# Patient Record
Sex: Female | Born: 1970 | Race: Black or African American | Hispanic: No | Marital: Single | Smoking: Never smoker
Health system: Southern US, Community
[De-identification: ages and names within clinical notes are randomized; demographics above are authoritative.]

## PROBLEM LIST (undated history)

## (undated) HISTORY — PX: TUBAL LIGATION: SHX77

---

## 2006-09-07 ENCOUNTER — Emergency Department: Payer: Self-pay | Admitting: Emergency Medicine

## 2010-10-21 ENCOUNTER — Ambulatory Visit: Payer: Self-pay | Admitting: Gastroenterology

## 2010-10-24 LAB — PATHOLOGY REPORT

## 2010-12-14 ENCOUNTER — Emergency Department: Payer: Self-pay | Admitting: Emergency Medicine

## 2012-10-08 ENCOUNTER — Ambulatory Visit: Payer: Self-pay | Admitting: Family Medicine

## 2012-10-17 ENCOUNTER — Ambulatory Visit: Payer: Self-pay | Admitting: Family Medicine

## 2013-01-16 ENCOUNTER — Ambulatory Visit: Payer: Self-pay | Admitting: Family Medicine

## 2014-12-10 LAB — HM PAP SMEAR: HM PAP: NEGATIVE

## 2015-04-07 LAB — HM PAP SMEAR: HM Pap smear: NORMAL

## 2015-06-11 ENCOUNTER — Other Ambulatory Visit: Payer: Self-pay | Admitting: Family Medicine

## 2015-06-11 DIAGNOSIS — Z1231 Encounter for screening mammogram for malignant neoplasm of breast: Secondary | ICD-10-CM

## 2015-06-21 ENCOUNTER — Ambulatory Visit
Admission: RE | Admit: 2015-06-21 | Discharge: 2015-06-21 | Disposition: A | Discharge status: Home / Self Care | Payer: 59 | Source: Ambulatory Visit | Attending: Family Medicine | Admitting: Family Medicine

## 2015-06-21 DIAGNOSIS — Z1231 Encounter for screening mammogram for malignant neoplasm of breast: Secondary | ICD-10-CM | POA: Insufficient documentation

## 2016-06-05 ENCOUNTER — Other Ambulatory Visit: Payer: Self-pay | Admitting: Family Medicine

## 2016-06-05 DIAGNOSIS — Z1231 Encounter for screening mammogram for malignant neoplasm of breast: Secondary | ICD-10-CM

## 2016-06-22 ENCOUNTER — Other Ambulatory Visit: Payer: Self-pay | Admitting: Family Medicine

## 2016-06-22 ENCOUNTER — Ambulatory Visit
Admission: RE | Admit: 2016-06-22 | Discharge: 2016-06-22 | Disposition: A | Discharge status: Home / Self Care | Payer: 59 | Source: Ambulatory Visit | Attending: Family Medicine | Admitting: Family Medicine

## 2016-06-22 DIAGNOSIS — Z1231 Encounter for screening mammogram for malignant neoplasm of breast: Secondary | ICD-10-CM | POA: Diagnosis not present

## 2017-06-06 LAB — LIPID PANEL
CHOLESTEROL: 198 (ref 0–200)
HDL: 66 (ref 35–70)
LDL Cholesterol: 112
Triglycerides: 98 (ref 40–160)

## 2017-06-06 LAB — HEMOGLOBIN A1C: Hemoglobin A1C: 5.3

## 2017-07-12 ENCOUNTER — Other Ambulatory Visit: Payer: Self-pay | Admitting: Family Medicine

## 2017-07-23 ENCOUNTER — Ambulatory Visit (INDEPENDENT_AMBULATORY_CARE_PROVIDER_SITE_OTHER): Payer: 59 | Admitting: Family Medicine

## 2017-07-23 ENCOUNTER — Encounter: Payer: Self-pay | Admitting: Family Medicine

## 2017-07-23 VITALS — BP 106/71 | HR 73 | Temp 98.3°F | Ht 65.1 in | Wt 180.4 lb

## 2017-07-23 DIAGNOSIS — Z1239 Encounter for other screening for malignant neoplasm of breast: Secondary | ICD-10-CM

## 2017-07-23 DIAGNOSIS — Z113 Encounter for screening for infections with a predominantly sexual mode of transmission: Secondary | ICD-10-CM

## 2017-07-23 DIAGNOSIS — Z0000 Encounter for general adult medical examination without abnormal findings: Secondary | ICD-10-CM

## 2017-07-23 DIAGNOSIS — Z23 Encounter for immunization: Secondary | ICD-10-CM | POA: Diagnosis not present

## 2017-07-23 DIAGNOSIS — K219 Gastro-esophageal reflux disease without esophagitis: Secondary | ICD-10-CM | POA: Diagnosis not present

## 2017-07-23 LAB — UA/M W/RFLX CULTURE, ROUTINE
Bilirubin, UA: NEGATIVE
Glucose, UA: NEGATIVE
Ketones, UA: NEGATIVE
Leukocytes, UA: NEGATIVE
Nitrite, UA: NEGATIVE
Protein, UA: NEGATIVE
RBC, UA: NEGATIVE
Specific Gravity, UA: 1.015 (ref 1.005–1.030)
Urobilinogen, Ur: 1 mg/dL (ref 0.2–1.0)
pH, UA: 7 (ref 5.0–7.5)

## 2017-07-23 LAB — MICROSCOPIC EXAMINATION
BACTERIA UA: NONE SEEN
RBC MICROSCOPIC, UA: NONE SEEN /HPF (ref 0–?)
WBC UA: NONE SEEN /HPF (ref 0–?)

## 2017-07-23 MED ORDER — OMEPRAZOLE 20 MG PO CPDR: 20.0000 mg | DELAYED_RELEASE_CAPSULE | Freq: Every day | ORAL | 3 refills | Status: DC

## 2017-07-23 NOTE — Progress Notes (Signed)
BP 106/71 (BP Location: Left Arm, Patient Position: Sitting, Cuff Size: Large)   Pulse 73   Temp 98.3 F (36.8 C)   Ht 5' 5.1" (1.654 m)   Wt 180 lb 7 oz (81.8 kg)   LMP 07/16/2017 (Approximate)   SpO2 99%   BMI 29.93 kg/m    Subjective:    Patient ID: Nancy Phillips, female    DOB: 20-Feb-1971, 46 y.o.   MRN: [id number]  HPI: Nancy Phillips is a 46 y.o. female presenting on 07/23/2017 to establish care and for comprehensive medical examination. She had been following with [facility] until last year. Had physical done in June 2017. Current medical complaints include:   GERD GERD control status: exacerbated  Satisfied with current treatment? no Heartburn frequency: at least 1x a week Medication side effects: Not on anything  Previous GERD medications: omeprazole Antacid use frequency:  Doesn't take Duration: for the past couple of months Nature: burning Location: sternal Heartburn duration: few days  Alleviatiating factors: Drinking a lot of water Aggravating factors: eating certain things Dysphagia: yes Odynophagia:  yes Hematemesis: no Blood in stool: no EGD: no  She currently lives with: husband Menopausal Symptoms: no  Depression Screen done today and results listed below:  Depression screen [facility] 2/9 07/23/2017  Decreased Interest 0  Down, Depressed, Hopeless 0  PHQ - 2 Score 0    Past Medical History:  No past medical history on file.  Surgical History:  Past Surgical History:  Procedure Laterality Date  . TUBAL LIGATION      Medications:  No current outpatient prescriptions on file prior to visit.   No current facility-administered medications on file prior to visit.     Allergies:  No Known Allergies  Social History:  Social History   Social History  . Marital status: Married    Spouse name: N/A  . Number of children: N/A  . Years of education: N/A   Occupational History  . Not on file.   Social History Main Topics  . Smoking status: Never Smoker   . Smokeless tobacco: Never Used  . Alcohol use No  . Drug use: No  . Sexual activity: Yes    Birth control/ protection: None   Other Topics Concern  . Not on file   Social History Narrative  . No narrative on file   History  Smoking Status  . Never Smoker  Smokeless Tobacco  . Never Used   History  Alcohol Use No    Family History:  Family History  Problem Relation Age of Onset  . Rheum arthritis Mother   . Hypertension Mother   . Fibromyalgia Mother   . Stroke Mother   . Cancer Father        Rare stomach cancer  . Cancer Brother        Lung  . Cancer Maternal Grandmother        Cancer  . Stroke Maternal Grandfather   . Birth defects Paternal Grandmother        Rare stomach cancer  . Cancer Sister        Pancreatic  . Cancer Sister        Pancreatic  . Breast cancer Neg Hx     Past medical history, surgical history, medications, allergies, family history and social history reviewed with patient today and changes made to appropriate areas of the chart.   Review of Systems  Constitutional: Positive for diaphoresis. Negative for chills, fever, malaise/fatigue and weight loss.  HENT:  Negative.   Eyes: Negative.   Respiratory: Negative.   Cardiovascular: Negative.   Gastrointestinal: Positive for constipation, diarrhea and heartburn. Negative for abdominal pain, blood in stool, melena, nausea and vomiting.  Genitourinary: Negative.   Musculoskeletal: Negative.   Skin: Negative.   Neurological: Negative.  Negative for weakness.  Endo/Heme/Allergies: Negative.  Negative for environmental allergies and polydipsia. Does not bruise/bleed easily.  Psychiatric/Behavioral: Negative.     All other ROS negative except what is listed above and in the HPI.      Objective:    BP 106/71 (BP Location: Left Arm, Patient Position: Sitting, Cuff Size: Large)   Pulse 73   Temp 98.3 F (36.8 C)   Ht 5' 5.1" (1.654 m)   Wt 180 lb 7 oz (81.8 kg)   LMP 07/16/2017  (Approximate)   SpO2 99%   BMI 29.93 kg/m   Wt Readings from Last 3 Encounters:  07/23/17 180 lb 7 oz (81.8 kg)    Physical Exam  Constitutional: She is oriented to person, place, and time. She appears well-developed and well-nourished. No distress.  HENT:  Head: Normocephalic and atraumatic.  Right Ear: Hearing normal.  Left Ear: Hearing normal.  Nose: Nose normal.  Eyes: Conjunctivae and lids are normal. Right eye exhibits no discharge. Left eye exhibits no discharge. No scleral icterus.  Pulmonary/Chest: Effort normal. No respiratory distress.  Genitourinary:  Genitourinary Comments: Breast and pelvic exams deferred with shared decision making.   Musculoskeletal: Normal range of motion.  Neurological: She is alert and oriented to person, place, and time.  Skin: Skin is intact. No rash noted. She is not diaphoretic.  Psychiatric: She has a normal mood and affect. Her speech is normal and behavior is normal. Judgment and thought content normal. Cognition and memory are normal.    Results for orders placed or performed in visit on 07/23/17  HM PAP SMEAR  Result Value Ref Range   HM Pap smear normal   HM PAP SMEAR  Result Value Ref Range   HM Pap smear Negative, Negative HPV   Lipid panel  Result Value Ref Range   Triglycerides 98 40 - 160   Cholesterol 198 0 - 200   HDL 66 35 - 70   LDL Cholesterol 112   Hemoglobin A1c  Result Value Ref Range   Hemoglobin A1C 5.3       Assessment & Plan:   Problem List Items Addressed This Visit      Digestive   Gastroesophageal reflux disease    Having some dysphagia. Will start omeprazole and recheck in 1 month, if not better, will refer to GI.      Relevant Medications   omeprazole (PRILOSEC) 20 MG capsule   Other Relevant Orders   CBC with Differential/Platelet   Comprehensive metabolic panel   UA/M w/rflx Culture, Routine    Other Visit Diagnoses    Routine general medical examination at a health care facility    -   Primary   Vaccines updated. Screening labs checked today. Pap up to date. Mammogram ordered. Continue diet and exercise. Call with any concerns.    Relevant Orders   CBC with Differential/Platelet   Comprehensive metabolic panel   TSH   UA/M w/rflx Culture, Routine   Screening for STD (sexually transmitted disease)       Labs drawn today. Await results.    Relevant Orders   GC/Chlamydia Probe Amp   Hepatitis C antibody   HSV(herpes simplex vrs) 1+2 ab-IgG  HIV antibody   RPR   Screening for breast cancer       Mammogram ordered today.   Relevant Orders   MM Digital Screening   Immunization due       Tdap given today.   Relevant Orders   Tdap vaccine greater than or equal to 7yo IM (Completed)       Follow up plan: Return in about 4 weeks (around 08/20/2017) for Follow up swallowing and GERD.   LABORATORY TESTING:  - Pap smear: up to date  IMMUNIZATIONS:   - Tdap: Tetanus vaccination status reviewed: Tdap vaccination indicated and given today. - Influenza: Given elsewhere  SCREENING: -Mammogram: Ordered today   PATIENT COUNSELING:   Advised to take 1 mg of folate supplement per day if capable of pregnancy.   Sexuality: Discussed sexually transmitted diseases, partner selection, use of condoms, avoidance of unintended pregnancy  and contraceptive alternatives.   Advised to avoid cigarette smoking.  I discussed with the patient that most people either abstain from alcohol or drink within safe limits (<=14/week and <=4 drinks/occasion for males, <=7/weeks and <= 3 drinks/occasion for females) and that the risk for alcohol disorders and other health effects rises proportionally with the number of drinks per week and how often a drinker exceeds daily limits.  Discussed cessation/primary prevention of drug use and availability of treatment for abuse.   Diet: Encouraged to adjust caloric intake to maintain  or achieve ideal body weight, to reduce intake of dietary  saturated fat and total fat, to limit sodium intake by avoiding high sodium foods and not adding table salt, and to maintain adequate dietary potassium and calcium preferably from fresh fruits, vegetables, and low-fat dairy products.    stressed the importance of regular exercise  Injury prevention: Discussed safety belts, safety helmets, smoke detector, smoking near bedding or upholstery.   Dental health: Discussed importance of regular tooth brushing, flossing, and dental visits.    NEXT PREVENTATIVE PHYSICAL DUE IN 1 YEAR. Return in about 4 weeks (around 08/20/2017) for Follow up swallowing and GERD.

## 2017-07-23 NOTE — Progress Notes (Deleted)
Ht 5' 5.1" (1.654 m)   Wt 180 lb 7 oz (81.8 kg)   LMP 07/16/2017 (Approximate)   BMI 29.93 kg/m    Subjective:    Patient ID: Nancy Phillips, female    DOB: 1971/01/12, 46 y.o.   MRN: [id number]  HPI: Nancy Phillips is a 46 y.o. female  Chief Complaint  Patient presents with  . Establish Care  . Gastroesophageal Reflux    Active Ambulatory Problems    Diagnosis Date Noted  . No Active Ambulatory Problems   Resolved Ambulatory Problems    Diagnosis Date Noted  . No Resolved Ambulatory Problems   No Additional Past Medical History   No past surgical history on file. No outpatient encounter prescriptions on file as of 07/23/2017.   No facility-administered encounter medications on file as of 07/23/2017.    No Known Allergies Family History  Problem Relation Age of Onset  . Breast cancer Neg Hx    Social History   Social History  . Marital status: Married    Spouse name: N/A  . Number of children: N/A  . Years of education: N/A   Occupational History  . Not on file.   Social History Main Topics  . Smoking status: Not on file  . Smokeless tobacco: Not on file  . Alcohol use Not on file  . Drug use: Unknown  . Sexual activity: Not on file   Other Topics Concern  . Not on file   Social History Narrative  . No narrative on file     Review of Systems  Per HPI unless specifically indicated above     Objective:    Ht 5' 5.1" (1.654 m)   Wt 180 lb 7 oz (81.8 kg)   LMP 07/16/2017 (Approximate)   BMI 29.93 kg/m   Wt Readings from Last 3 Encounters:  07/23/17 180 lb 7 oz (81.8 kg)    Physical Exam  Results for orders placed or performed in visit on 10/21/10  Pathology Report  Result Value Ref Range   Patholgy report      ========== TEST NAME ==========  ========= RESULTS =========  = REFERENCE RANGE =  PATHOLOGY REPORT Pathology Report .                               [   Final Report         ]                   Material submitted:                                              Marland Kitchen PART A: ANTRUM AND BODY OF STOMACH COLD BIOPSIES PART B: GE JUNCTION COLD BIOPSIES .                               [   Final Report         ]                   Clinician provided ICD-9: 787.02 ; Nausea alone 530.81 ; Esophageal reflux .                               [  Final Report         ]                   Pre-operative diagnosis:                                        . NAUSEA, GERD .                               [   Final Report         ]                   ********************************************************************** Diagnosis: Part A: ANTRUM AND BODY OF STOMACH COLD BIOPSIES: - ANTRAL AND OXYNTIC MUCOSA WITH MILD CHRONIC GASTRITIS. - NEGATIVE FOR H.PYLORI, DYSPLASIA AND MALIGNANCY. . Part B:  GE JUNCTION COLD BIOPSIES: - SQUAMOCOLUMNAR MUCOSA WITH REFLUX GASTROESOPHAGITIS. - NEGATIVE FOR DYSPLASIA AND MALIGNANCY. . . Comment A Diff Quick stain was examined and is negative for H.pylori. Controls worked appropriately. RUB/10/24/2010 ********************************************************************** .                               [   Final Report         ]                   Electronically signed:                                          Marland Kitchen Hulda Humphrey, MD, Pathologist .                               [   Final Report         ]                   Gross description:                                              . A.  Antrum and Body of Stomach: Received in a formalin filled container labeled Nancy Phillips are two tan irregular soft tissue fragments ranging from 0.2 to 0.3 cm in greatest dimensions. Biopsies are submitted as received in one cassette. . B.  GE Junction: Received in a formalin filled container labeled Nancy Phillips is a tan irregula r soft tissue fragment 0.2 cm in greatest dimensions.  Biopsies submitted as received in one cassette. DMG/ASB .                               [   Final Report         ]                    Pathologist provided ICD-9: 535.50, 530.11 .                               [  Final Report         ]                   CPT                                                                  . [id number], [id number], [id number], [phone]   Additional Info: [id number]           [facility]            No: [id number]           [address], [place], [place] [address]           Lindon Romp, MD         [phone]                                         Co: [phone]         Assessment & Plan:   Problem List Items Addressed This Visit    None       Follow up plan: No Follow-up on file.

## 2017-07-23 NOTE — Patient Instructions (Addendum)
Health Maintenance, Female Adopting a healthy lifestyle and getting preventive care can go a long way to promote health and wellness. Talk with your health care provider about what schedule of regular examinations is right for you. This is a good chance for you to check in with your provider about disease prevention and staying healthy. In between checkups, there are plenty of things you can do on your own. Experts have done a lot of research about which lifestyle changes and preventive measures are most likely to keep you healthy. Ask your health care provider for more information. Weight and diet Eat a healthy diet  Be sure to include plenty of vegetables, fruits, low-fat dairy products, and lean protein.  Do not eat a lot of foods high in solid fats, added sugars, or salt.  Get regular exercise. This is one of the most important things you can do for your health. ? Most adults should exercise for at least 150 minutes each week. The exercise should increase your heart rate and make you sweat (moderate-intensity exercise). ? Most adults should also do strengthening exercises at least twice a week. This is in addition to the moderate-intensity exercise.  Maintain a healthy weight  Body mass index (BMI) is a measurement that can be used to identify possible weight problems. It estimates body fat based on height and weight. Your health care provider can help determine your BMI and help you achieve or maintain a healthy weight.  For females 20 years of age and older: ? A BMI below 18.5 is considered underweight. ? A BMI of 18.5 to 24.9 is normal. ? A BMI of 25 to 29.9 is considered overweight. ? A BMI of 30 and above is considered obese.  Watch levels of cholesterol and blood lipids  You should start having your blood tested for lipids and cholesterol at 45 years of age, then have this test every 5 years.  You may need to have your cholesterol levels checked more often if: ? Your lipid or  cholesterol levels are high. ? You are older than 45 years of age. ? You are at high risk for heart disease.  Cancer screening Lung Cancer  Lung cancer screening is recommended for adults 55-80 years old who are at high risk for lung cancer because of a history of smoking.  A yearly low-dose CT scan of the lungs is recommended for people who: ? Currently smoke. ? Have quit within the past 15 years. ? Have at least a 30-pack-year history of smoking. A pack year is smoking an average of one pack of cigarettes a day for 1 year.  Yearly screening should continue until it has been 15 years since you quit.  Yearly screening should stop if you develop a health problem that would prevent you from having lung cancer treatment.  Breast Cancer  Practice breast self-awareness. This means understanding how your breasts normally appear and feel.  It also means doing regular breast self-exams. Let your health care provider know about any changes, no matter how small.  If you are in your 20s or 30s, you should have a clinical breast exam (CBE) by a health care provider every 1-3 years as part of a regular health exam.  If you are 40 or older, have a CBE every year. Also consider having a breast X-ray (mammogram) every year.  If you have a family history of breast cancer, talk to your health care provider about genetic screening.  If you are at high risk   for breast cancer, talk to your health care provider about having an MRI and a mammogram every year.  Breast cancer gene (BRCA) assessment is recommended for women who have family members with BRCA-related cancers. BRCA-related cancers include: ? Breast. ? Ovarian. ? Tubal. ? Peritoneal cancers.  Results of the assessment will determine the need for genetic counseling and BRCA1 and BRCA2 testing.  Cervical Cancer Your health care provider may recommend that you be screened regularly for cancer of the pelvic organs (ovaries, uterus, and  vagina). This screening involves a pelvic examination, including checking for microscopic changes to the surface of your cervix (Pap test). You may be encouraged to have this screening done every 3 years, beginning at age 22.  For women ages 56-65, health care providers may recommend pelvic exams and Pap testing every 3 years, or they may recommend the Pap and pelvic exam, combined with testing for human papilloma virus (HPV), every 5 years. Some types of HPV increase your risk of cervical cancer. Testing for HPV may also be done on women of any age with unclear Pap test results.  Other health care providers may not recommend any screening for nonpregnant women who are considered low risk for pelvic cancer and who do not have symptoms. Ask your health care provider if a screening pelvic exam is right for you.  If you have had past treatment for cervical cancer or a condition that could lead to cancer, you need Pap tests and screening for cancer for at least 20 years after your treatment. If Pap tests have been discontinued, your risk factors (such as having a new sexual partner) need to be reassessed to determine if screening should resume. Some women have medical problems that increase the chance of getting cervical cancer. In these cases, your health care provider may recommend more frequent screening and Pap tests.  Colorectal Cancer  This type of cancer can be detected and often prevented.  Routine colorectal cancer screening usually begins at 45 years of age and continues through 45 years of age.  Your health care provider may recommend screening at an earlier age if you have risk factors for colon cancer.  Your health care provider may also recommend using home test kits to check for hidden blood in the stool.  A small camera at the end of a tube can be used to examine your colon directly (sigmoidoscopy or colonoscopy). This is done to check for the earliest forms of colorectal  cancer.  Routine screening usually begins at age 33.  Direct examination of the colon should be repeated every 5-10 years through 45 years of age. However, you may need to be screened more often if early forms of precancerous polyps or small growths are found.  Skin Cancer  Check your skin from head to toe regularly.  Tell your health care provider about any new moles or changes in moles, especially if there is a change in a mole's shape or color.  Also tell your health care provider if you have a mole that is larger than the size of a pencil eraser.  Always use sunscreen. Apply sunscreen liberally and repeatedly throughout the day.  Protect yourself by wearing long sleeves, pants, a wide-brimmed hat, and sunglasses whenever you are outside.  Heart disease, diabetes, and high blood pressure  High blood pressure causes heart disease and increases the risk of stroke. High blood pressure is more likely to develop in: ? People who have blood pressure in the high end of  the normal range (130-139/85-89 mm Hg). ? People who are overweight or obese. ? People who are African American.  If you are 21-29 years of age, have your blood pressure checked every 3-5 years. If you are 3 years of age or older, have your blood pressure checked every year. You should have your blood pressure measured twice-once when you are at a hospital or clinic, and once when you are not at a hospital or clinic. Record the average of the two measurements. To check your blood pressure when you are not at a hospital or clinic, you can use: ? An automated blood pressure machine at a pharmacy. ? A home blood pressure monitor.  If you are between 17 years and 37 years old, ask your health care provider if you should take aspirin to prevent strokes.  Have regular diabetes screenings. This involves taking a blood sample to check your fasting blood sugar level. ? If you are at a normal weight and have a low risk for diabetes,  have this test once every three years after 45 years of age. ? If you are overweight and have a high risk for diabetes, consider being tested at a younger age or more often. Preventing infection Hepatitis B  If you have a higher risk for hepatitis B, you should be screened for this virus. You are considered at high risk for hepatitis B if: ? You were born in a country where hepatitis B is common. Ask your health care provider which countries are considered high risk. ? Your parents were born in a high-risk country, and you have not been immunized against hepatitis B (hepatitis B vaccine). ? You have HIV or AIDS. ? You use needles to inject street drugs. ? You live with someone who has hepatitis B. ? You have had sex with someone who has hepatitis B. ? You get hemodialysis treatment. ? You take certain medicines for conditions, including cancer, organ transplantation, and autoimmune conditions.  Hepatitis C  Blood testing is recommended for: ? Everyone born from [birth year] through 1964. ? Anyone with known risk factors for hepatitis C.  Sexually transmitted infections (STIs)  You should be screened for sexually transmitted infections (STIs) including gonorrhea and chlamydia if: ? You are sexually active and are younger than 45 years of age. ? You are older than 45 years of age and your health care provider tells you that you are at risk for this type of infection. ? Your sexual activity has changed since you were last screened and you are at an increased risk for chlamydia or gonorrhea. Ask your health care provider if you are at risk.  If you do not have HIV, but are at risk, it may be recommended that you take a prescription medicine daily to prevent HIV infection. This is called pre-exposure prophylaxis (PrEP). You are considered at risk if: ? You are sexually active and do not regularly use condoms or know the HIV status of your partner(s). ? You take drugs by injection. ? You are  sexually active with a partner who has HIV.  Talk with your health care provider about whether you are at high risk of being infected with HIV. If you choose to begin PrEP, you should first be tested for HIV. You should then be tested every 3 months for as long as you are taking PrEP. Pregnancy  If you are premenopausal and you may become pregnant, ask your health care provider about preconception counseling.  If you may become  pregnant, take 400 to 800 micrograms (mcg) of folic acid every day.  If you want to prevent pregnancy, talk to your health care provider about birth control (contraception). Osteoporosis and menopause  Osteoporosis is a disease in which the bones lose minerals and strength with aging. This can result in serious bone fractures. Your risk for osteoporosis can be identified using a bone density scan.  If you are 63 years of age or older, or if you are at risk for osteoporosis and fractures, ask your health care provider if you should be screened.  Ask your health care provider whether you should take a calcium or vitamin D supplement to lower your risk for osteoporosis.  Menopause may have certain physical symptoms and risks.  Hormone replacement therapy may reduce some of these symptoms and risks. Talk to your health care provider about whether hormone replacement therapy is right for you. Follow these instructions at home:  Schedule regular health, dental, and eye exams.  Stay current with your immunizations.  Do not use any tobacco products including cigarettes, chewing tobacco, or electronic cigarettes.  If you are pregnant, do not drink alcohol.  If you are breastfeeding, limit how much and how often you drink alcohol.  Limit alcohol intake to no more than 1 drink per day for nonpregnant women. One drink equals 12 ounces of beer, 5 ounces of wine, or 1 ounces of hard liquor.  Do not use street drugs.  Do not share needles.  Ask your health care  provider for help if you need support or information about quitting drugs.  Tell your health care provider if you often feel depressed.  Tell your health care provider if you have ever been abused or do not feel safe at home. This information is not intended to replace advice given to you by your health care provider. Make sure you discuss any questions you have with your health care provider. Document Released: 05/08/2011 Document Revised: 03/30/2016 Document Reviewed: 07/27/2015 Elsevier Interactive Patient Education  2018 [employer] for Gastroesophageal Reflux Disease, Adult When you have gastroesophageal reflux disease (GERD), the foods you eat and your eating habits are very important. Choosing the right foods can help ease the discomfort of GERD. Consider working with a diet and nutrition specialist (dietitian) to help you make healthy food choices. What general guidelines should I follow? Eating plan  Choose healthy foods low in fat, such as fruits, vegetables, whole grains, low-fat dairy products, and lean meat, fish, and poultry.  Eat frequent, small meals instead of three large meals each day. Eat your meals slowly, in a relaxed setting. Avoid bending over or lying down until 2-3 hours after eating.  Limit high-fat foods such as fatty meats or fried foods.  Limit your intake of oils, butter, and shortening to less than 8 teaspoons each day.  Avoid the following: ? Foods that cause symptoms. These may be different for different people. Keep a food diary to keep track of foods that cause symptoms. ? Alcohol. ? Drinking large amounts of liquid with meals. ? Eating meals during the 2-3 hours before bed.  Cook foods using methods other than frying. This may include baking, grilling, or broiling. Lifestyle   Maintain a healthy weight. Ask your health care provider what weight is healthy for you. If you need to lose weight, work with your health care provider to  do so safely.  Exercise for at least 30 minutes on 5 or more days each week, or  as told by your health care provider.  Avoid wearing clothes that fit tightly around your waist and chest.  Do not use any products that contain nicotine or tobacco, such as cigarettes and e-cigarettes. If you need help quitting, ask your health care provider.  Sleep with the head of your bed raised. Use a wedge under the mattress or blocks under the bed frame to raise the head of the bed. What foods are not recommended? The items listed may not be a complete list. Talk with your dietitian about what dietary choices are best for you. Grains Pastries or quick breads with added fat. [place] toast. Vegetables Deep fried vegetables. [place] fries. Any vegetables prepared with added fat. Any vegetables that cause symptoms. For some people this may include tomatoes and tomato products, chili peppers, onions and garlic, and horseradish. Fruits Any fruits prepared with added fat. Any fruits that cause symptoms. For some people this may include citrus fruits, such as oranges, grapefruit, pineapple, and lemons. Meats and other protein foods High-fat meats, such as fatty beef or pork, hot dogs, ribs, ham, sausage, salami and bacon. Fried meat or protein, including fried fish and fried chicken. Nuts and nut butters. Dairy Whole milk and chocolate milk. Sour cream. Cream. Ice cream. Cream cheese. Milk shakes. Beverages Coffee and tea, with or without caffeine. Carbonated beverages. Sodas. Energy drinks. Fruit juice made with acidic fruits (such as orange or grapefruit). Tomato juice. Alcoholic drinks. Fats and oils Butter. Margarine. Shortening. Ghee. Sweets and desserts Chocolate and cocoa. Donuts. Seasoning and other foods Pepper. Peppermint and spearmint. Any condiments, herbs, or seasonings that cause symptoms. For some people, this may include curry, hot sauce, or vinegar-based salad dressings. Summary  When you have  gastroesophageal reflux disease (GERD), food and lifestyle choices are very important to help ease the discomfort of GERD.  Eat frequent, small meals instead of three large meals each day. Eat your meals slowly, in a relaxed setting. Avoid bending over or lying down until 2-3 hours after eating.  Limit high-fat foods such as fatty meat or fried foods. This information is not intended to replace advice given to you by your health care provider. Make sure you discuss any questions you have with your health care provider. Document Released: 10/23/2005 Document Revised: 10/24/2016 Document Reviewed: 10/24/2016 Elsevier Interactive Patient Education  2017 [employer]. Tdap Vaccine (Tetanus, Diphtheria and Pertussis): What You Need to Know 1. Why get vaccinated? Tetanus, diphtheria and pertussis are very serious diseases. Tdap vaccine can protect [place] from these diseases. And, Tdap vaccine given to pregnant women can protect newborn babies against pertussis. TETANUS (Lockjaw) is rare in the [place] today. It causes painful muscle tightening and stiffness, usually all over the body.  It can lead to tightening of muscles in the head and neck so you can't open your mouth, swallow, or sometimes even breathe. Tetanus kills about 1 out of 10 people who are infected even after receiving the best medical care.  DIPHTHERIA is also rare in the [place] today. It can cause a thick coating to form in the back of the throat.  It can lead to breathing problems, heart failure, paralysis, and death.  PERTUSSIS (Whooping Cough) causes severe coughing spells, which can cause difficulty breathing, vomiting and disturbed sleep.  It can also lead to weight loss, incontinence, and rib fractures. Up to 2 in 100 adolescents and 5 in 100 adults with pertussis are hospitalized or have complications, which could include pneumonia or death.  These diseases are caused by bacteria. Diphtheria and pertussis are spread  from person to person through secretions from coughing or sneezing. Tetanus enters the body through cuts, scratches, or wounds. Before vaccines, as many as 200,000 cases of diphtheria, 200,000 cases of pertussis, and hundreds of cases of tetanus, were reported in the [place] each year. Since vaccination began, reports of cases for tetanus and diphtheria have dropped by about 99% and for pertussis by about 80%. 2. Tdap vaccine Tdap vaccine can protect adolescents and adults from tetanus, diphtheria, and pertussis. One dose of Tdap is routinely given at age 56 or 15. People who did not get Tdap at that age should get it as soon as possible. Tdap is especially important for healthcare professionals and anyone having close contact with a baby younger than 12 months. Pregnant women should get a dose of Tdap during every pregnancy, to protect the newborn from pertussis. Infants are most at risk for severe, life-threatening complications from pertussis. Another vaccine, called Td, protects against tetanus and diphtheria, but not pertussis. A Td booster should be given every 10 years. Tdap may be given as one of these boosters if you have never gotten Tdap before. Tdap may also be given after a severe cut or burn to prevent tetanus infection. Your doctor or the person giving you the vaccine can give you more information. Tdap may safely be given at the same time as other vaccines. 3. Some people should not get this vaccine  A person who has ever had a life-threatening allergic reaction after a previous dose of any diphtheria, tetanus or pertussis containing vaccine, OR has a severe allergy to any part of this vaccine, should not get Tdap vaccine. Tell the person giving the vaccine about any severe allergies.  Anyone who had coma or long repeated seizures within 7 days after a childhood dose of DTP or DTaP, or a previous dose of Tdap, should not get Tdap, unless a cause other than the vaccine was found.  They can still get Td.  Talk to your doctor if you: ? have seizures or another nervous system problem, ? had severe pain or swelling after any vaccine containing diphtheria, tetanus or pertussis, ? ever had a condition called Guillain-Barr Syndrome (GBS), ? aren't feeling well on the day the shot is scheduled. 4. Risks With any medicine, including vaccines, there is a chance of side effects. These are usually mild and go away on their own. Serious reactions are also possible but are rare. Most people who get Tdap vaccine do not have any problems with it. Mild problems following Tdap: (Did not interfere with activities)  Pain where the shot was given (about 3 in 4 adolescents or 2 in 3 adults)  Redness or swelling where the shot was given (about 1 person in 5)  Mild fever of at least 100.59F (up to about 1 in 25 adolescents or 1 in 100 adults)  Headache (about 3 or 4 people in 10)  Tiredness (about 1 person in 3 or 4)  Nausea, vomiting, diarrhea, stomach ache (up to 1 in 4 adolescents or 1 in 10 adults)  Chills, sore joints (about 1 person in 10)  Body aches (about 1 person in 3 or 4)  Rash, swollen glands (uncommon)  Moderate problems following Tdap: (Interfered with activities, but did not require medical attention)  Pain where the shot was given (up to 1 in 5 or 6)  Redness or swelling where the shot was given (up to  about 1 in 16 adolescents or 1 in 12 adults)  Fever over 102F (about 1 in 100 adolescents or 1 in 250 adults)  Headache (about 1 in 7 adolescents or 1 in 10 adults)  Nausea, vomiting, diarrhea, stomach ache (up to 1 or 3 people in 100)  Swelling of the entire arm where the shot was given (up to about 1 in 500).  Severe problems following Tdap: (Unable to perform usual activities; required medical attention)  Swelling, severe pain, bleeding and redness in the arm where the shot was given (rare).  Problems that could happen after any  vaccine:  People sometimes faint after a medical procedure, including vaccination. Sitting or lying down for about 15 minutes can help prevent fainting, and injuries caused by a fall. Tell your doctor if you feel dizzy, or have vision changes or ringing in the ears.  Some people get severe pain in the shoulder and have difficulty moving the arm where a shot was given. This happens very rarely.  Any medication can cause a severe allergic reaction. Such reactions from a vaccine are very rare, estimated at fewer than 1 in a million doses, and would happen within a few minutes to a few hours after the vaccination. As with any medicine, there is a very remote chance of a vaccine causing a serious injury or death. The safety of vaccines is always being monitored. For more information, visit: http://www.aguilar.org/ 5. What if there is a serious problem? What should I look for? Look for anything that concerns you, such as signs of a severe allergic reaction, very high fever, or unusual behavior. Signs of a severe allergic reaction can include hives, swelling of the face and throat, difficulty breathing, a fast heartbeat, dizziness, and weakness. These would usually start a few minutes to a few hours after the vaccination. What should I do?  If you think it is a severe allergic reaction or other emergency that can't wait, call 9-1-1 or get the person to the nearest hospital. Otherwise, call your doctor.  Afterward, the reaction should be reported to the Vaccine Adverse Event Reporting System (VAERS). Your doctor might file this report, or you can do it yourself through the VAERS web site at www.vaers.SamedayNews.es, or by calling (405)698-5254. ? VAERS does not give medical advice. 6. The National Vaccine Injury Compensation Program The [employer] Vaccine Injury Compensation Program (VICP) is a federal program that was created to compensate people who may have been injured by certain vaccines. Persons who  believe they may have been injured by a vaccine can learn about the program and about filing a claim by calling 3160669920 or visiting the [place] website at GoldCloset.com.ee. There is a time limit to file a claim for compensation. 7. How can I learn more?  Ask your doctor. He or she can give you the vaccine package insert or suggest other sources of information.  Call your local or state health department.  Contact the Centers for Disease Control and Prevention (CDC): ? Call 629-330-5001 (1-800-CDC-INFO) or ? Visit CDC's website at http://hunter.com/ CDC Tdap Vaccine VIS (12/30/13) This information is not intended to replace advice given to you by your health care provider. Make sure you discuss any questions you have with your health care provider. Document Released: 04/23/2012 Document Revised: 07/13/2016 Document Reviewed: 07/13/2016 Elsevier Interactive Patient Education  2017 [employer].

## 2017-07-23 NOTE — Assessment & Plan Note (Signed)
Having some dysphagia. Will start omeprazole and recheck in 1 month, if not better, will refer to GI.

## 2017-07-24 LAB — COMPREHENSIVE METABOLIC PANEL
ALBUMIN: 4.1 g/dL (ref 3.5–5.5)
ALT: 10 IU/L (ref 0–32)
AST: 14 IU/L (ref 0–40)
Albumin/Globulin Ratio: 1.3 (ref 1.2–2.2)
Alkaline Phosphatase: 62 IU/L (ref 39–117)
BUN / CREAT RATIO: 12 (ref 9–23)
BUN: 8 mg/dL (ref 6–24)
CALCIUM: 9.3 mg/dL (ref 8.7–10.2)
CHLORIDE: 102 mmol/L (ref 96–106)
CO2: 24 mmol/L (ref 20–29)
Creatinine, Ser: 0.67 mg/dL (ref 0.57–1.00)
GFR, EST AFRICAN AMERICAN: 123 mL/min/{1.73_m2} (ref >59)
GFR, EST NON AFRICAN AMERICAN: 106 mL/min/{1.73_m2} (ref >59)
GLUCOSE: 91 mg/dL (ref 65–99)
Globulin, Total: 3.2 g/dL (ref 1.5–4.5)
Potassium: 3.8 mmol/L (ref 3.5–5.2)
Sodium: 140 mmol/L (ref 134–144)
TOTAL PROTEIN: 7.3 g/dL (ref 6.0–8.5)

## 2017-07-24 LAB — RPR: RPR: NONREACTIVE

## 2017-07-24 LAB — CBC WITH DIFFERENTIAL/PLATELET
BASOS ABS: 0 10*3/uL (ref 0.0–0.2)
Basos: 0 %
EOS (ABSOLUTE): 0.1 10*3/uL (ref 0.0–0.4)
Eos: 1 %
HEMOGLOBIN: 12.2 g/dL (ref 11.1–15.9)
Hematocrit: 37.3 % (ref 34.0–46.6)
Immature Grans (Abs): 0 10*3/uL (ref 0.0–0.1)
Immature Granulocytes: 0 %
LYMPHS ABS: 2.4 10*3/uL (ref 0.7–3.1)
Lymphs: 39 %
MCH: 27.6 pg (ref 26.6–33.0)
MCHC: 32.7 g/dL (ref 31.5–35.7)
MCV: 84 fL (ref 79–97)
MONOS ABS: 0.4 10*3/uL (ref 0.1–0.9)
Monocytes: 7 %
NEUTROS ABS: 3.2 10*3/uL (ref 1.4–7.0)
Neutrophils: 53 %
Platelets: 362 10*3/uL (ref 150–379)
RBC: 4.42 x10E6/uL (ref 3.77–5.28)
RDW: 14.3 % (ref 12.3–15.4)
WBC: 6.1 10*3/uL (ref 3.4–10.8)

## 2017-07-24 LAB — HIV ANTIBODY (ROUTINE TESTING W REFLEX): HIV SCREEN 4TH GENERATION: NONREACTIVE

## 2017-07-24 LAB — HEPATITIS C ANTIBODY: Hep C Virus Ab: <0.1 s/co ratio (ref 0.0–0.9)

## 2017-07-24 LAB — TSH: TSH: 2.52 u[IU]/mL (ref 0.450–4.500)

## 2017-07-24 LAB — HSV(HERPES SIMPLEX VRS) I + II AB-IGG
HSV 1 Glycoprotein G Ab, IgG: 1.7 index — ABNORMAL HIGH (ref 0.00–0.90)
HSV 2 IgG, Type Spec: >23.6 index — ABNORMAL HIGH (ref 0.00–0.90)

## 2017-07-25 LAB — GC/CHLAMYDIA PROBE AMP

## 2017-07-26 ENCOUNTER — Telehealth: Payer: Self-pay | Admitting: Family Medicine

## 2017-07-26 LAB — GC/CHLAMYDIA PROBE AMP
CHLAMYDIA, DNA PROBE: NEGATIVE
NEISSERIA GONORRHOEAE BY PCR: NEGATIVE

## 2017-07-26 NOTE — Telephone Encounter (Signed)
Patient notified

## 2017-07-26 NOTE — Telephone Encounter (Signed)
Amy, would you please let her know that her labs just came back and everything looks good. 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 Everything else was negative and her regular labs looked great! Thanks!

## 2017-10-10 ENCOUNTER — Ambulatory Visit
Admission: RE | Admit: 2017-10-10 | Discharge: 2017-10-10 | Disposition: A | Discharge status: Home / Self Care | Payer: 59 | Source: Ambulatory Visit | Attending: Family Medicine | Admitting: Family Medicine

## 2017-10-10 DIAGNOSIS — Z1231 Encounter for screening mammogram for malignant neoplasm of breast: Secondary | ICD-10-CM | POA: Insufficient documentation

## 2017-10-10 DIAGNOSIS — Z1239 Encounter for other screening for malignant neoplasm of breast: Secondary | ICD-10-CM

## 2017-10-11 ENCOUNTER — Encounter: Payer: Self-pay | Admitting: Family Medicine

## 2017-12-10 ENCOUNTER — Ambulatory Visit (INDEPENDENT_AMBULATORY_CARE_PROVIDER_SITE_OTHER): Payer: Managed Care, Other (non HMO) | Admitting: Family Medicine

## 2017-12-10 ENCOUNTER — Encounter: Payer: Self-pay | Admitting: Family Medicine

## 2017-12-10 VITALS — BP 143/89 | HR 92 | Temp 97.9°F | Wt 183.0 lb

## 2017-12-10 DIAGNOSIS — R0989 Other specified symptoms and signs involving the circulatory and respiratory systems: Secondary | ICD-10-CM

## 2017-12-10 DIAGNOSIS — N926 Irregular menstruation, unspecified: Secondary | ICD-10-CM | POA: Diagnosis not present

## 2017-12-10 MED ORDER — FLUTICASONE PROPIONATE 50 MCG/ACT NA SUSP: 2.0000 | Freq: Every day | NASAL | 6 refills | Status: DC

## 2017-12-10 MED ORDER — PREDNISONE 50 MG PO TABS: 50.0000 mg | ORAL_TABLET | Freq: Every day | ORAL | 0 refills | Status: DC

## 2017-12-10 NOTE — Progress Notes (Signed)
BP (!) 143/89 (BP Location: Left Arm, Patient Position: Sitting, Cuff Size: Normal)   Pulse 92   Temp 97.9 F (36.6 C)   Wt 183 lb (83 kg)   SpO2 98%   BMI 30.36 kg/m    Subjective:    Patient ID: Nancy Phillips, female    DOB: 1971-04-07, 47 y.o.   MRN: [id number]  HPI: Nancy Phillips is a 47 y.o. female  Chief Complaint  Patient presents with  . Menstrual Problem    Patient states that she was having all of the symptoms of her period, but only spotted, then about two weeks later it started again but she started bleeding heavy for 2.5 days  . lump in throat    Patient states that it feels like she has a lump in her throat.    ABNORMAL MENSTRUAL PERIODS Duration: About 2 months, has symptoms for about 2 weeks out, then will spot and then will have nothing and then bleep a couple of weeks later, then was coming heavily for like 2.5 days. Has not been under extra stress, feeling OK Average interval between menses: Having trouble telling how far between her periods are Length of menses: off and on for several days, then heavy for a couple of days Flow: spotting to heavy Dysmenorrhea: yes Intermenstrual bleeding:yes Postcoital bleeding: no Contraception: tubal ligation Sexual activity: In a Monogamous Relationship History of sexually transmitted diseases: no History GYN procedures: yes Abnormal pap smears: no   Dyspareunia: no Vaginal discharge:no Abdominal pain: no Galactorrhea: no Hirsuitism: no Frequent bruising/mucosal bleeding: no Double vision:no Hot flashes: yes  Has been having some drainage, has been having a lump in her throat and can't get phlegm up, no trouble swallowing. Has been feeling well otherwise  Relevant past medical, surgical, family and social history reviewed and updated as indicated. Interim medical history since our last visit reviewed. Allergies and medications reviewed and updated.  Review of Systems  Constitutional: Negative.   HENT:  Positive for congestion and postnasal drip. Negative for dental problem, drooling, ear discharge, ear pain, facial swelling, hearing loss, mouth sores, nosebleeds, rhinorrhea, sinus pressure, sinus pain, sneezing, sore throat, tinnitus, trouble swallowing and voice change.   Respiratory: Negative.   Cardiovascular: Negative.   Genitourinary: Negative for decreased urine volume, difficulty urinating, dyspareunia, dysuria, enuresis, flank pain, frequency, genital sores, hematuria, menstrual problem, pelvic pain, urgency, vaginal bleeding, vaginal discharge and vaginal pain.  Psychiatric/Behavioral: Negative.     Per HPI unless specifically indicated above     Objective:    BP (!) 143/89 (BP Location: Left Arm, Patient Position: Sitting, Cuff Size: Normal)   Pulse 92   Temp 97.9 F (36.6 C)   Wt 183 lb (83 kg)   SpO2 98%   BMI 30.36 kg/m   Wt Readings from Last 3 Encounters:  12/10/17 183 lb (83 kg)  07/23/17 180 lb 7 oz (81.8 kg)    Physical Exam  Constitutional: She is oriented to person, place, and time. She appears well-developed and well-nourished. No distress.  HENT:  Head: Normocephalic and atraumatic.  Right Ear: Hearing and external ear normal.  Left Ear: Hearing and external ear normal.  Nose: Nose normal.  Mouth/Throat: Oropharynx is clear and moist. No oropharyngeal exudate.  Eyes: Conjunctivae, EOM and lids are normal. Pupils are equal, round, and reactive to light. Right eye exhibits no discharge. Left eye exhibits no discharge. No scleral icterus.  Neck: Normal range of motion. Neck supple. No JVD present. No tracheal deviation  present. No thyromegaly present.  Cardiovascular: Normal rate, regular rhythm, normal heart sounds and intact distal pulses. Exam reveals no gallop and no friction rub.  No murmur heard. Pulmonary/Chest: Effort normal and breath sounds normal. No stridor. No respiratory distress. She has no wheezes. She has no rales. She exhibits no tenderness.    Musculoskeletal: Normal range of motion.  Lymphadenopathy:    She has no cervical adenopathy.  Neurological: She is alert and oriented to person, place, and time.  Skin: Skin is warm, dry and intact. No rash noted. She is not diaphoretic. No erythema. No pallor.  Psychiatric: She has a normal mood and affect. Her speech is normal and behavior is normal. Judgment and thought content normal. Cognition and memory are normal.  Nursing note and vitals reviewed.   Results for orders placed or performed in visit on 07/23/17  GC/Chlamydia Probe Amp  Result Value Ref Range   Chlamydia trachomatis, NAA CANCELED    Neisseria gonorrhoeae by PCR CANCELED   Microscopic Examination  Result Value Ref Range   WBC, UA None seen 0 - 5 /hpf   RBC, UA None seen 0 - 2 /hpf   Epithelial Cells (non renal) 0-10 0 - 10 /hpf   Bacteria, UA None seen None seen/Few  GC/Chlamydia Probe Amp  Result Value Ref Range   Chlamydia trachomatis, NAA Negative Negative   Neisseria gonorrhoeae by PCR Negative Negative  Hepatitis C antibody  Result Value Ref Range   Hep C Virus Ab <0.1 0.0 - 0.9 s/co ratio  HSV(herpes simplex vrs) 1+2 ab-IgG  Result Value Ref Range   HSV 1 Glycoprotein G Ab, IgG 1.70 (H) 0.00 - 0.90 index   HSV 2 IgG, Type Spec >23.60 (H) 0.00 - 0.90 index  HIV antibody  Result Value Ref Range   HIV Screen 4th Generation wRfx Non Reactive Non Reactive  RPR  Result Value Ref Range   RPR Ser Ql Non Reactive Non Reactive  HM PAP SMEAR  Result Value Ref Range   HM Pap smear normal   CBC with Differential/Platelet  Result Value Ref Range   WBC 6.1 3.4 - 10.8 x10E3/uL   RBC 4.42 3.77 - 5.28 x10E6/uL   Hemoglobin 12.2 11.1 - 15.9 g/dL   Hematocrit 37.3 34.0 - 46.6 %   MCV 84 79 - 97 fL   MCH 27.6 26.6 - 33.0 pg   MCHC 32.7 31.5 - 35.7 g/dL   RDW 14.3 12.3 - 15.4 %   Platelets 362 150 - 379 x10E3/uL   Neutrophils 53 Not Estab. %   Lymphs 39 Not Estab. %   Monocytes 7 Not Estab. %   Eos 1 Not  Estab. %   Basos 0 Not Estab. %   Neutrophils Absolute 3.2 1.4 - 7.0 x10E3/uL   Lymphocytes Absolute 2.4 0.7 - 3.1 x10E3/uL   Monocytes Absolute 0.4 0.1 - 0.9 x10E3/uL   EOS (ABSOLUTE) 0.1 0.0 - 0.4 x10E3/uL   Basophils Absolute 0.0 0.0 - 0.2 x10E3/uL   Immature Granulocytes 0 Not Estab. %   Immature Grans (Abs) 0.0 0.0 - 0.1 x10E3/uL  Comprehensive metabolic panel  Result Value Ref Range   Glucose 91 65 - 99 mg/dL   BUN 8 6 - 24 mg/dL   Creatinine, Ser 0.67 0.57 - 1.00 mg/dL   GFR calc non Af Amer 106 >59 mL/min/1.73   GFR calc Af Amer 123 >59 mL/min/1.73   BUN/Creatinine Ratio 12 9 - 23   Sodium 140 134 - 144  mmol/L   Potassium 3.8 3.5 - 5.2 mmol/L   Chloride 102 96 - 106 mmol/L   CO2 24 20 - 29 mmol/L   Calcium 9.3 8.7 - 10.2 mg/dL   Total Protein 7.3 6.0 - 8.5 g/dL   Albumin 4.1 3.5 - 5.5 g/dL   Globulin, Total 3.2 1.5 - 4.5 g/dL   Albumin/Globulin Ratio 1.3 1.2 - 2.2   Bilirubin Total <0.2 0.0 - 1.2 mg/dL   Alkaline Phosphatase 62 39 - 117 IU/L   AST 14 0 - 40 IU/L   ALT 10 0 - 32 IU/L  TSH  Result Value Ref Range   TSH 2.520 0.450 - 4.500 uIU/mL  UA/M w/rflx Culture, Routine  Result Value Ref Range   Specific Gravity, UA 1.015 1.005 - 1.030   pH, UA 7.0 5.0 - 7.5   Color, UA Yellow Yellow   Appearance Ur Cloudy (A) Clear   Leukocytes, UA Negative Negative   Protein, UA Negative Negative/Trace   Glucose, UA Negative Negative   Ketones, UA Negative Negative   RBC, UA Negative Negative   Bilirubin, UA Negative Negative   Urobilinogen, Ur 1.0 0.2 - 1.0 mg/dL   Nitrite, UA Negative Negative   Microscopic Examination See below:   HM PAP SMEAR  Result Value Ref Range   HM Pap smear Negative, Negative HPV   Lipid panel  Result Value Ref Range   Triglycerides 98 40 - 160   Cholesterol 198 0 - 200   HDL 66 35 - 70   LDL Cholesterol 112   Hemoglobin A1c  Result Value Ref Range   Hemoglobin A1C 5.3       Assessment & Plan:   Problem List Items Addressed This  Visit    None    Visit Diagnoses    Abnormal menstrual cycle    -  Primary   Likely perimenopausal. Will check labs. Await results. Information provided. Call with any concerns.    Relevant Orders   Prolactin   LH   FSH   Estradiol   Testosterone, free, total([facility])   TSH   Phlegm in throat       Will treat with prednisone and start flonase when she finishes. Call if not getting better or getting worse.        Follow up plan: No Follow-up on file.

## 2017-12-10 NOTE — Patient Instructions (Addendum)
Perimenopause Perimenopause is the time when your body begins to move into the menopause (no menstrual period for 12 straight months). It is a natural process. Perimenopause can begin 2-8 years before the menopause and usually lasts for 1 year after the menopause. During this time, your ovaries may or may not produce an egg. The ovaries vary in their production of estrogen and progesterone hormones each month. This can cause irregular menstrual periods, difficulty getting pregnant, vaginal bleeding between periods, and uncomfortable symptoms. What are the causes?  Irregular production of the ovarian hormones, estrogen and progesterone, and not ovulating every month. Other causes include:  Tumor of the pituitary gland in the brain.  Medical disease that affects the ovaries.  Radiation treatment.  Chemotherapy.  Unknown causes.  Heavy smoking and excessive alcohol intake can bring on perimenopause sooner. What are the signs or symptoms?  Hot flashes.  Night sweats.  Irregular menstrual periods.  Decreased sex drive.  Vaginal dryness.  Headaches.  Mood swings.  Depression.  Memory problems.  Irritability.  Tiredness.  Weight gain.  Trouble getting pregnant.  The beginning of losing bone cells (osteoporosis).  The beginning of hardening of the arteries (atherosclerosis). How is this diagnosed? Your health care provider will make a diagnosis by analyzing your age, menstrual history, and symptoms. He or she will do a physical exam and note any changes in your body, especially your female organs. Female hormone tests may or may not be helpful depending on the amount of female hormones you produce and when you produce them. However, other hormone tests may be helpful to rule out other problems. How is this treated? In some cases, no treatment is needed. The decision on whether treatment is necessary during the perimenopause should be made by you and your health care  provider based on how the symptoms are affecting you and your lifestyle. Various treatments are available, such as:  Treating individual symptoms with a specific medicine for that symptom.  Herbal medicines that can help specific symptoms.  Counseling.  Group therapy. Follow these instructions at home:  Keep track of your menstrual periods (when they occur, how heavy they are, how long between periods, and how long they last) as well as your symptoms and when they started.  Only take over-the-counter or prescription medicines as directed by your health care provider.  Sleep and rest.  Exercise.  Eat a diet that contains calcium (good for your bones) and soy (acts like the estrogen hormone).  Do not smoke.  Avoid alcoholic beverages.  Take vitamin supplements as recommended by your health care provider. Taking vitamin E may help in certain cases.  Take calcium and vitamin D supplements to help prevent bone loss.  Group therapy is sometimes helpful.  Acupuncture may help in some cases. Contact a health care provider if:  You have questions about any symptoms you are having.  You need a referral to a specialist (gynecologist, psychiatrist, or psychologist). Get help right away if:  You have vaginal bleeding.  Your period lasts longer than 8 days.  Your periods are recurring sooner than 21 days.  You have bleeding after intercourse.  You have severe depression.  You have pain when you urinate.  You have severe headaches.  You have vision problems. This information is not intended to replace advice given to you by your health care provider. Make sure you discuss any questions you have with your health care provider. Document Released: 11/30/2004 Document Revised: 03/30/2016 Document Reviewed: 05/22/2013 Elsevier Interactive   Patient Education  2017 [employer]  

## 2017-12-12 LAB — TESTOSTERONE, FREE, TOTAL, SHBG
Sex Hormone Binding: 81.9 nmol/L (ref 24.6–122.0)
Testosterone, Free: 0.6 pg/mL (ref 0.0–4.2)
Testosterone: 14 ng/dL (ref 8–48)

## 2017-12-12 LAB — FOLLICLE STIMULATING HORMONE: FSH: 20.1 m[IU]/mL

## 2017-12-12 LAB — LUTEINIZING HORMONE: LH: 31.3 m[IU]/mL

## 2017-12-12 LAB — ESTRADIOL: Estradiol: 77.5 pg/mL

## 2017-12-12 LAB — PROLACTIN: PROLACTIN: 39.7 ng/mL — AB (ref 4.8–23.3)

## 2017-12-12 LAB — TSH: TSH: 3.14 u[IU]/mL (ref 0.450–4.500)

## 2017-12-13 ENCOUNTER — Telehealth: Payer: Self-pay | Admitting: Family Medicine

## 2017-12-13 DIAGNOSIS — R7989 Other specified abnormal findings of blood chemistry: Secondary | ICD-10-CM

## 2017-12-13 DIAGNOSIS — E229 Hyperfunction of pituitary gland, unspecified: Principal | ICD-10-CM

## 2017-12-13 NOTE — Telephone Encounter (Signed)
Results read to patient; verbalizes understanding. Result note was not routed to [facility].

## 2017-12-13 NOTE — Telephone Encounter (Signed)
Please let her know that all her labs came back normal except her prolactin which came back slightly hight. This could be because of her phentermine, So I want to check it again next time. No sign she's gone through the change, so she's probably peri-menopausal. Thanks!

## 2017-12-13 NOTE — Telephone Encounter (Signed)
Called and left a message for patient to return my call.  CRM Created. Please release results.

## 2018-11-22 LAB — HM MAMMOGRAPHY

## 2018-12-10 LAB — HM PAP SMEAR

## 2019-11-29 IMAGING — MG MM DIGITAL SCREENING BILAT W/ TOMO W/ CAD
8 of 12 series · 8 of 28 positions shown · non-contrast
Comparison: Previous exam(s).

CLINICAL DATA: Screening.

EXAM:
2D DIGITAL SCREENING BILATERAL MAMMOGRAM WITH CAD AND ADJUNCT TOMO

[L MLO synth-2D]
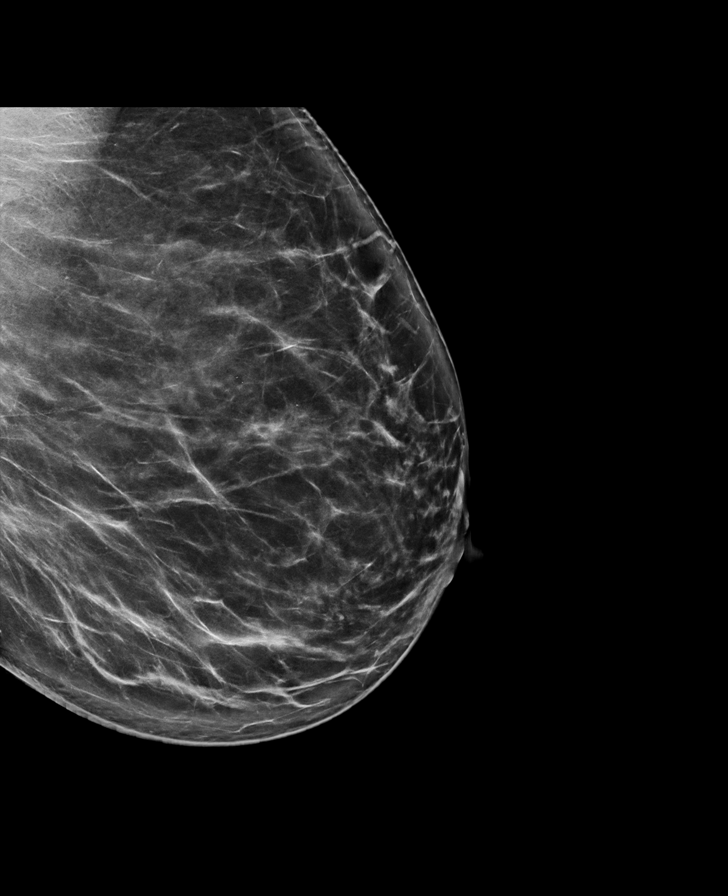

[R CC synth-2D]
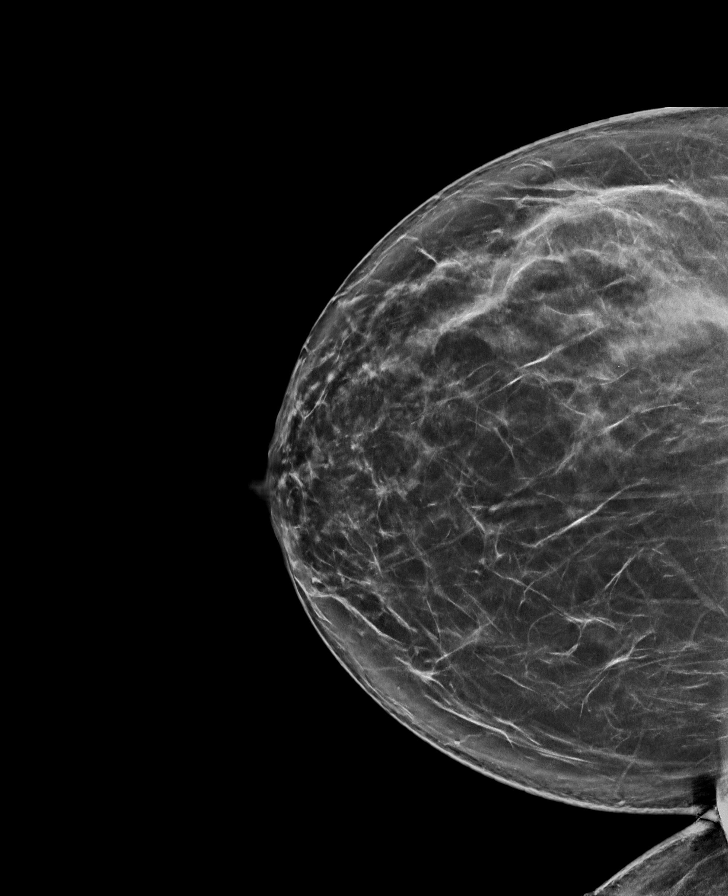

[R CC]
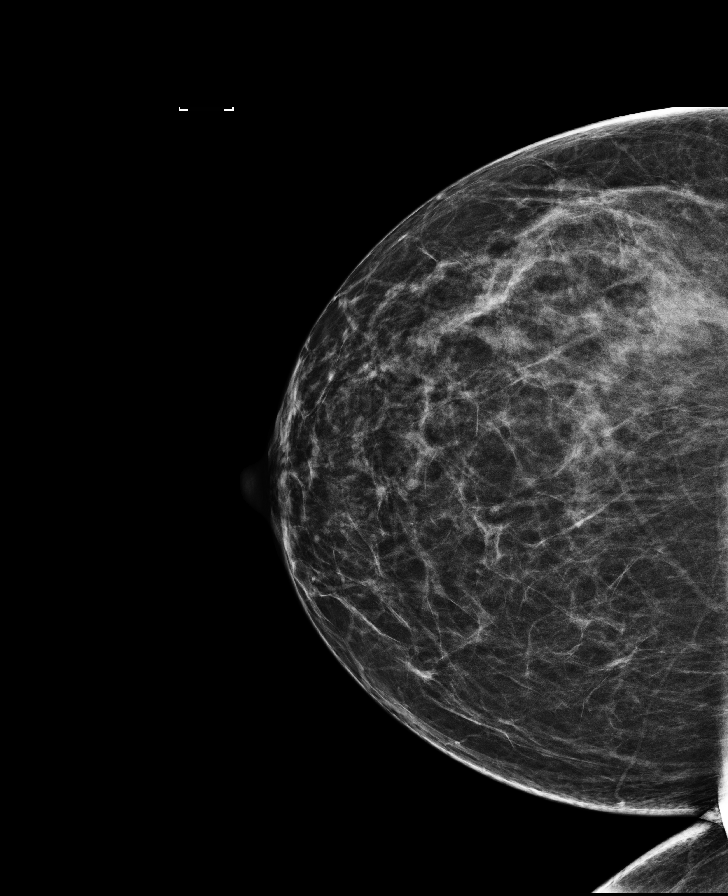

[L MLO]
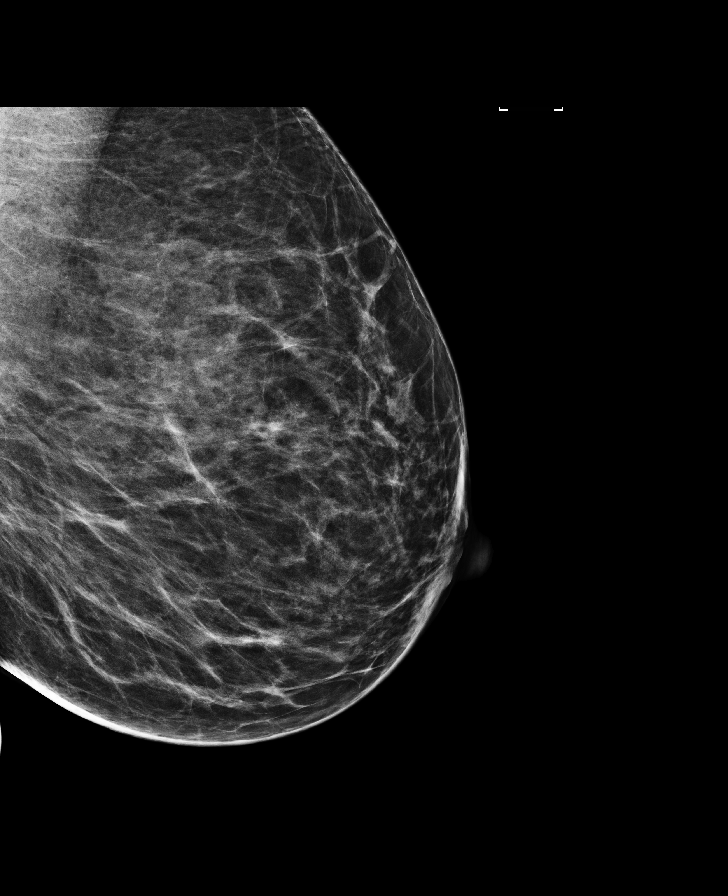

[L CC]
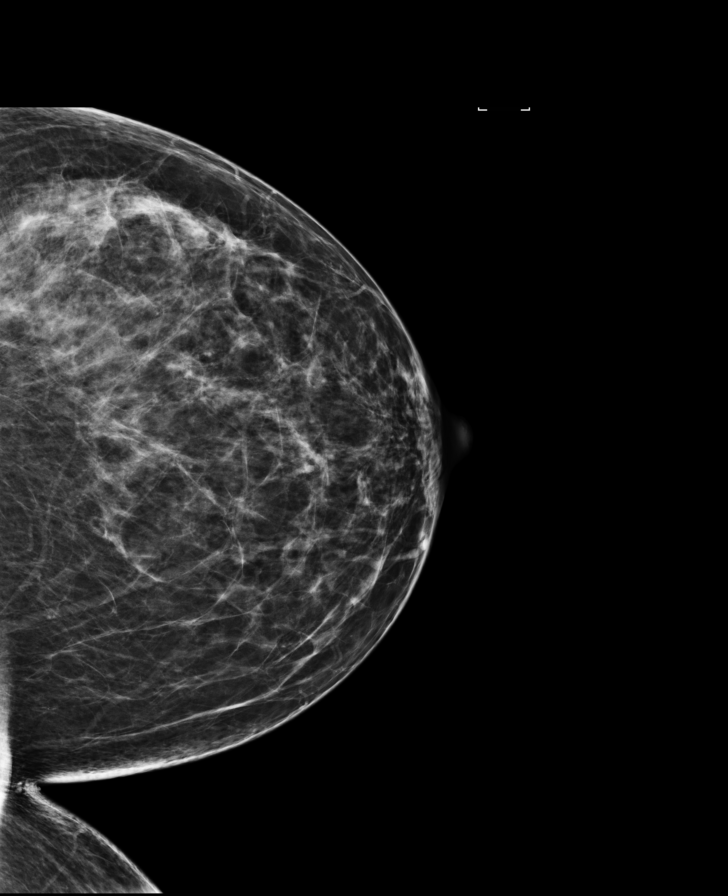

[R MLO]
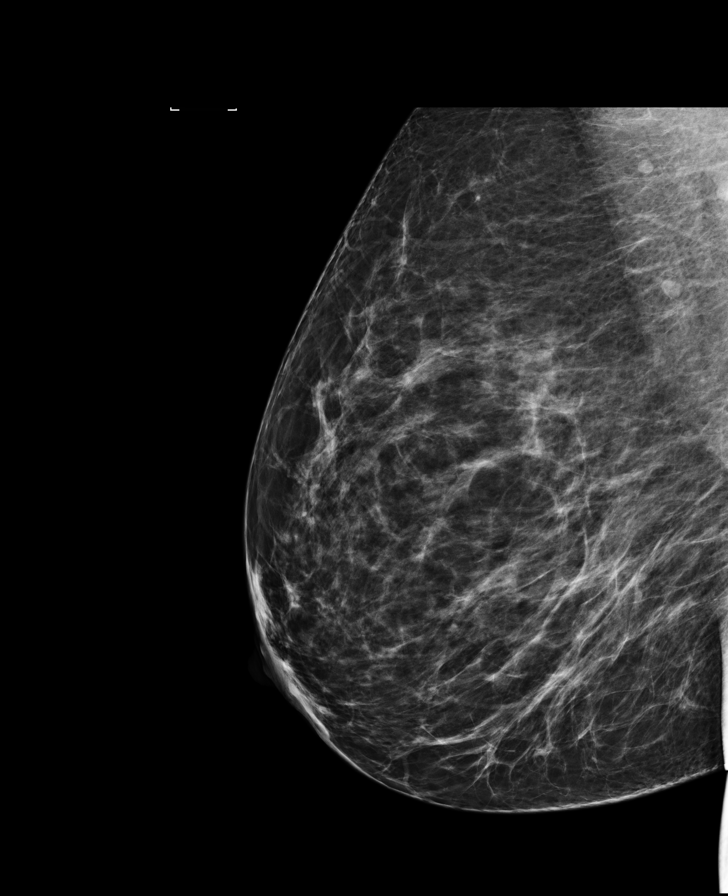

[L CC synth-2D]
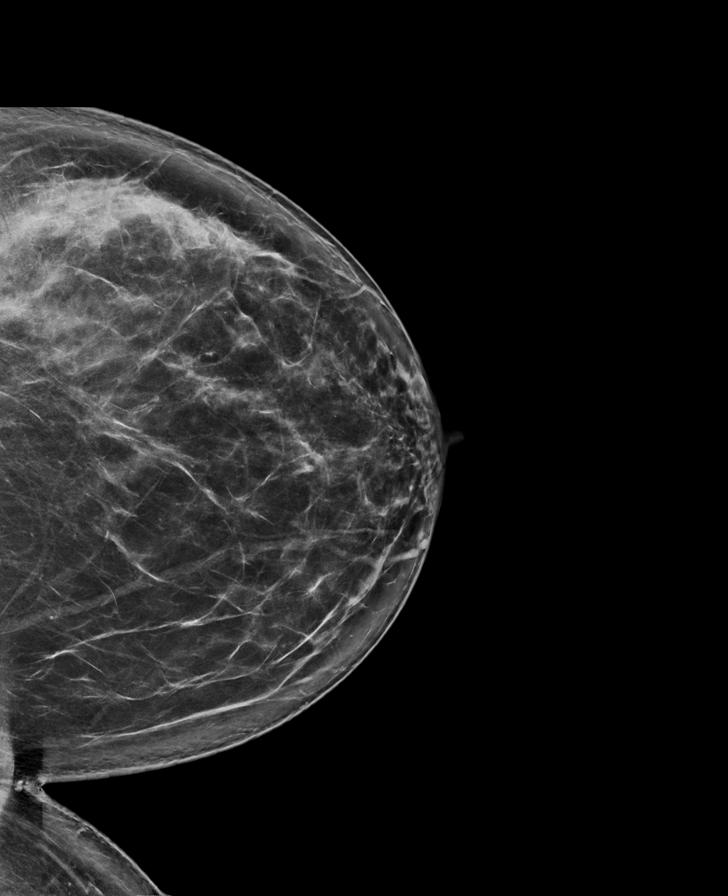

[R MLO synth-2D]
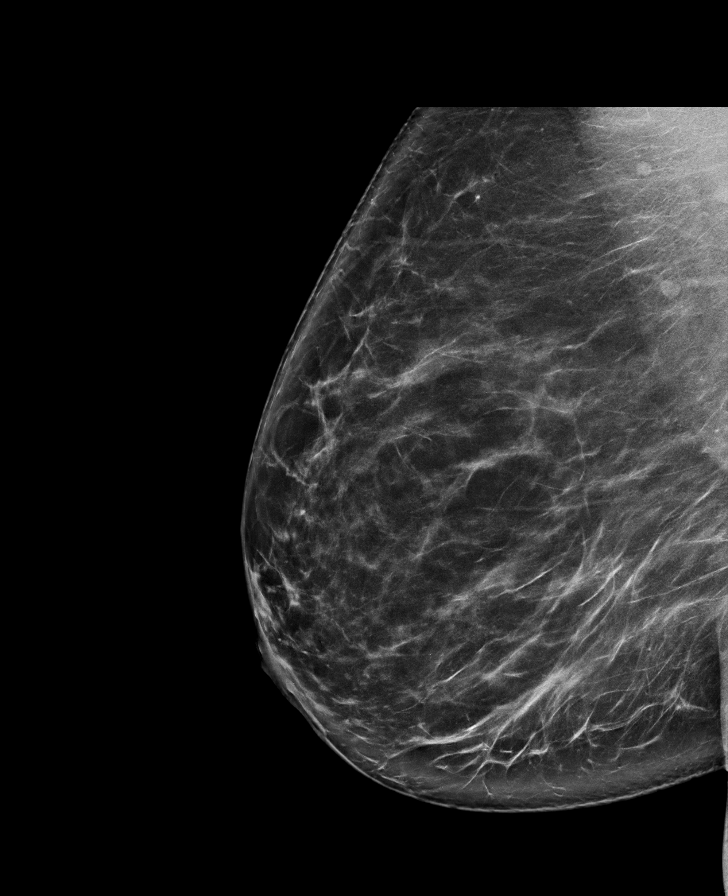

[8 of 28 positions shown; findings below may reference images not displayed]

ACR Breast Density Category b: There are scattered areas of
fibroglandular density.
FINDINGS: There are no findings suspicious for malignancy. Images were
processed with CAD.
IMPRESSION: No mammographic evidence of malignancy. A result letter of this
screening mammogram will be mailed directly to the patient.

RECOMMENDATION:
Screening mammogram in one year. (Code:97-6-RS4)

BI-RADS CATEGORY  1: Negative.

## 2019-12-30 LAB — HM MAMMOGRAPHY

## 2020-01-31 ENCOUNTER — Ambulatory Visit: Payer: Managed Care, Other (non HMO) | Attending: Internal Medicine

## 2020-01-31 DIAGNOSIS — Z23 Encounter for immunization: Secondary | ICD-10-CM

## 2020-01-31 NOTE — Progress Notes (Signed)
   Covid-19 Vaccination Clinic  Name:  Nancy Phillips    MRN: [id number] DOB: [birth year]  01/31/2020  [name] was observed post Covid-19 immunization for 15 minutes without incident. She was provided with Vaccine Information Sheet and instruction to access the V-Safe system.   [name] was instructed to call 911 with any severe reactions post vaccine: Marland Kitchen Difficulty breathing  . Swelling of face and throat  . A fast heartbeat  . A bad rash all over body  . Dizziness and weakness   Immunizations Administered    Name Date Dose VIS Date Route   Pfizer COVID-19 Vaccine 01/31/2020  3:38 PM 0.3 mL 10/17/2019 Intramuscular   Manufacturer: [place]   Lot: [id number]   [place]: [id number]

## 2020-02-25 ENCOUNTER — Ambulatory Visit: Payer: Managed Care, Other (non HMO) | Attending: Internal Medicine

## 2020-02-25 DIAGNOSIS — Z23 Encounter for immunization: Secondary | ICD-10-CM

## 2020-02-25 NOTE — Progress Notes (Signed)
   Covid-19 Vaccination Clinic  Name:  Rockelle Kuperus    MRN: [id number] DOB: [birth year]  02/25/2020  Ms. Ryberg was observed post Covid-19 immunization for 15 minutes without incident. She was provided with Vaccine Information Sheet and instruction to access the V-Safe system.   Ms. Lea was instructed to call 911 with any severe reactions post vaccine: Marland Kitchen Difficulty breathing  . Swelling of face and throat  . A fast heartbeat  . A bad rash all over body  . Dizziness and weakness   Immunizations Administered    Name Date Dose VIS Date Route   Pfizer COVID-19 Vaccine 02/25/2020  1:33 PM 0.3 mL 12/31/2018 Intramuscular   Manufacturer: [place]   Lot: [id number]   [place]: [id number]

## 2021-01-03 LAB — HM MAMMOGRAPHY: HM Mammogram: NORMAL (ref 0–4)

## 2021-01-03 LAB — HM PAP SMEAR

## 2021-04-06 HISTORY — PX: ABLATION: SHX5711

## 2021-09-13 ENCOUNTER — Ambulatory Visit: Payer: Managed Care, Other (non HMO) | Admitting: Family Medicine

## 2021-09-13 ENCOUNTER — Other Ambulatory Visit: Payer: Self-pay

## 2021-09-13 ENCOUNTER — Encounter: Payer: Self-pay | Admitting: Family Medicine

## 2021-09-13 VITALS — BP 118/77 | HR 65 | Ht 64.5 in | Wt 185.4 lb

## 2021-09-13 DIAGNOSIS — M542 Cervicalgia: Secondary | ICD-10-CM | POA: Diagnosis not present

## 2021-09-13 MED ORDER — CYCLOBENZAPRINE HCL 10 MG PO TABS: 10.0000 mg | ORAL_TABLET | Freq: Every evening | ORAL | 1 refills | Status: DC | PRN

## 2021-09-13 MED ORDER — NAPROXEN 500 MG PO TABS: 500.0000 mg | ORAL_TABLET | Freq: Two times a day (BID) | ORAL | 3 refills | Status: AC

## 2021-09-13 NOTE — Progress Notes (Signed)
BP 118/77   Pulse 65   Ht 5' 4.5" (1.638 m)   Wt 185 lb 6.4 oz (84.1 kg)   SpO2 99%   BMI 31.33 kg/m    Subjective:    Patient ID: Nancy Phillips, female    DOB: November 24, 1970, 50 y.o.   MRN: [id number]  HPI: Nancy Phillips is a 50 y.o. female  Chief Complaint  Patient presents with   Neck Pain    Patient states she is having neck pain that feels like a strain, causing headaches.   NECK PAIN  Duration: about a month- has been getting worse Status: worse Treatments attempted: rest, heat, and ibuprofen  Relief with NSAIDs?:  moderate Location:midline Duration: about  a month Severity: moderate Quality: pulling Frequency: intermittent Radiation: back and head Aggravating factors: sitting up for a long time on the computer Alleviating factors: ibuprofen Weakness:  no Paresthesias / decreased sensation:  no  Fevers:  no  Relevant past medical, surgical, family and social history reviewed and updated as indicated. Interim medical history since our last visit reviewed. Allergies and medications reviewed and updated.  Review of Systems  Constitutional: Negative.   Respiratory: Negative.    Cardiovascular: Negative.   Gastrointestinal: Negative.   Musculoskeletal:  Positive for myalgias, neck pain and neck stiffness. Negative for arthralgias, back pain, gait problem and joint swelling.  Skin: Negative.   Neurological: Negative.   Psychiatric/Behavioral: Negative.     Per HPI unless specifically indicated above     Objective:    BP 118/77   Pulse 65   Ht 5' 4.5" (1.638 m)   Wt 185 lb 6.4 oz (84.1 kg)   SpO2 99%   BMI 31.33 kg/m   Wt Readings from Last 3 Encounters:  09/13/21 185 lb 6.4 oz (84.1 kg)  12/10/17 183 lb (83 kg)  07/23/17 180 lb 7 oz (81.8 kg)    Physical Exam Vitals and nursing note reviewed.  Constitutional:      General: She is not in acute distress.    Appearance: Normal appearance. She is not ill-appearing, toxic-appearing or diaphoretic.   HENT:     Head: Normocephalic and atraumatic.     Right Ear: External ear normal.     Left Ear: External ear normal.     Nose: Nose normal.     Mouth/Throat:     Mouth: Mucous membranes are moist.     Pharynx: Oropharynx is clear.  Eyes:     General: No scleral icterus.       Right eye: No discharge.        Left eye: No discharge.     Extraocular Movements: Extraocular movements intact.     Conjunctiva/sclera: Conjunctivae normal.     Pupils: Pupils are equal, round, and reactive to light.  Cardiovascular:     Rate and Rhythm: Normal rate and regular rhythm.     Pulses: Normal pulses.     Heart sounds: Normal heart sounds. No murmur heard.   No friction rub. No gallop.  Pulmonary:     Effort: Pulmonary effort is normal. No respiratory distress.     Breath sounds: Normal breath sounds. No stridor. No wheezing, rhonchi or rales.  Chest:     Chest wall: No tenderness.  Musculoskeletal:        General: Normal range of motion.     Cervical back: Normal range of motion and neck supple.  Skin:    General: Skin is warm and dry.     Capillary  Refill: Capillary refill takes less than 2 seconds.     Coloration: Skin is not jaundiced or pale.     Findings: No bruising, erythema, lesion or rash.  Neurological:     General: No focal deficit present.     Mental Status: She is alert and oriented to person, place, and time. Mental status is at baseline.  Psychiatric:        Mood and Affect: Mood normal.        Behavior: Behavior normal.        Thought Content: Thought content normal.        Judgment: Judgment normal.    Results for orders placed or performed in visit on 01/07/21  HM MAMMOGRAPHY  Result Value Ref Range   HM Mammogram Self Reported Normal 0-4 Bi-Rad, Self Reported Normal      Assessment & Plan:   Problem List Items Addressed This Visit   None Visit Diagnoses     Neck pain    -  Primary   Will treat with naproxen and flexeril and exercises. Call with any  concerns or if not getting better. Continue to monitor.         Follow up plan: Return in about 1 week (around 09/20/2021), or physical.

## 2021-10-26 ENCOUNTER — Ambulatory Visit (INDEPENDENT_AMBULATORY_CARE_PROVIDER_SITE_OTHER): Payer: Managed Care, Other (non HMO) | Admitting: Family Medicine

## 2021-10-26 ENCOUNTER — Other Ambulatory Visit: Payer: Self-pay

## 2021-10-26 ENCOUNTER — Encounter: Payer: Self-pay | Admitting: Family Medicine

## 2021-10-26 VITALS — BP 113/74 | HR 80 | Temp 98.8°F | Ht 64.0 in | Wt 186.6 lb

## 2021-10-26 DIAGNOSIS — G8929 Other chronic pain: Secondary | ICD-10-CM | POA: Diagnosis not present

## 2021-10-26 DIAGNOSIS — M25512 Pain in left shoulder: Secondary | ICD-10-CM | POA: Diagnosis not present

## 2021-10-26 DIAGNOSIS — Z1211 Encounter for screening for malignant neoplasm of colon: Secondary | ICD-10-CM | POA: Diagnosis not present

## 2021-10-26 DIAGNOSIS — Z0000 Encounter for general adult medical examination without abnormal findings: Secondary | ICD-10-CM | POA: Diagnosis not present

## 2021-10-26 DIAGNOSIS — R7989 Other specified abnormal findings of blood chemistry: Secondary | ICD-10-CM

## 2021-10-26 LAB — URINALYSIS, ROUTINE W REFLEX MICROSCOPIC
Bilirubin, UA: NEGATIVE
Glucose, UA: NEGATIVE
Ketones, UA: NEGATIVE
Leukocytes,UA: NEGATIVE
Nitrite, UA: NEGATIVE
Protein,UA: NEGATIVE
RBC, UA: NEGATIVE
Specific Gravity, UA: 1.02 (ref 1.005–1.030)
Urobilinogen, Ur: 0.2 mg/dL (ref 0.2–1.0)
pH, UA: 5 (ref 5.0–7.5)

## 2021-10-26 MED ORDER — CYCLOBENZAPRINE HCL 10 MG PO TABS: 10.0000 mg | ORAL_TABLET | Freq: Every evening | ORAL | 1 refills | Status: DC | PRN

## 2021-10-26 NOTE — Progress Notes (Signed)
BP 113/74    Pulse 80    Temp 98.8 F (37.1 C)    Ht 5\' 4"  (1.626 m)    Wt 186 lb 9.6 oz (84.6 kg)    SpO2 97%    BMI 32.03 kg/m    Subjective:    Patient ID: Nancy Phillips, female    DOB: Jan 12, 1971, 50 y.o.   MRN: [id number]  HPI: Nancy Phillips is a 50 y.o. female presenting on 10/26/2021 for comprehensive medical examination. Current medical complaints include:  SHOULDER PAIN Duration: a few months Involved shoulder: left Mechanism of injury: unknown Location: anterior Onset:gradual Severity: moderate  Quality:  aching Frequency:  when she wakes up in the am Radiation: no Aggravating factors: waking up  Alleviating factors: moving it around  Status: stable Treatments attempted: HEP  Relief with NSAIDs?:  moderate Weakness: yes Numbness: yes Decreased grip strength: yes Redness: no Swelling: no Bruising: no Fevers: no  Menopausal Symptoms: a bit of hot flashes  Depression Screen done today and results listed below:  Depression screen [facility] 2/9 10/26/2021 09/13/2021 07/23/2017  Decreased Interest 0 1 0  Down, Depressed, Hopeless 0 0 0  PHQ - 2 Score 0 1 0  Altered sleeping 1 2 -  Tired, decreased energy 1 1 -  Change in appetite 0 0 -  Feeling bad or failure about yourself  0 0 -  Trouble concentrating 0 1 -  Moving slowly or fidgety/restless 0 0 -  Suicidal thoughts 0 0 -  PHQ-9 Score 2 5 -  Difficult doing work/chores - Somewhat difficult -    Past Medical History:  History reviewed. No pertinent past medical history.  Surgical History:  Past Surgical History:  Procedure Laterality Date   TUBAL LIGATION      Medications:  Current Outpatient Medications on File Prior to Visit  Medication Sig   fluticasone (FLONASE) 50 MCG/ACT nasal spray Place 2 sprays into both nostrils daily.   naproxen (NAPROSYN) 500 MG tablet Take 1 tablet (500 mg total) by mouth 2 (two) times daily with a meal.   No current facility-administered medications on file prior to  visit.    Allergies:  No Known Allergies  Social History:  Social History   Socioeconomic History   Marital status: Single    Spouse name: Not on file   Number of children: Not on file   Years of education: Not on file   Highest education level: Not on file  Occupational History   Not on file  Tobacco Use   Smoking status: Never   Smokeless tobacco: Never  Vaping Use   Vaping Use: Never used  Substance and Sexual Activity   Alcohol use: No   Drug use: No   Sexual activity: Yes    Birth control/protection: None  Other Topics Concern   Not on file  Social History Narrative   Not on file   Social Determinants of Health   Financial Resource Strain: Not on file  Food Insecurity: Not on file  Transportation Needs: Not on file  Physical Activity: Not on file  Stress: Not on file  Social Connections: Not on file  Intimate Partner Violence: Not on file   Social History   Tobacco Use  Smoking Status Never  Smokeless Tobacco Never   Social History   Substance and Sexual Activity  Alcohol Use No    Family History:  Family History  Problem Relation Age of Onset   Rheum arthritis Mother    Hypertension Mother  Fibromyalgia Mother    Stroke Mother    Cancer Father        Rare stomach cancer   Cancer Brother        Lung   Cancer Maternal Grandmother        Cancer   Stroke Maternal Grandfather    Birth defects Paternal Grandmother        Rare stomach cancer   Cancer Sister        Pancreatic   Cancer Sister        Pancreatic   Breast cancer Neg Hx     Past medical history, surgical history, medications, allergies, family history and social history reviewed with patient today and changes made to appropriate areas of the chart.   Review of Systems  Constitutional: Negative.   HENT: Negative.    Eyes:  Positive for blurred vision. Negative for double vision, photophobia, pain, discharge and redness.  Respiratory: Negative.    Cardiovascular:  Positive  for leg swelling. Negative for chest pain, palpitations, orthopnea, claudication and PND.  Gastrointestinal: Negative.   Genitourinary: Negative.   Musculoskeletal: Negative.   Skin: Negative.   Neurological: Negative.   Endo/Heme/Allergies:  Positive for environmental allergies and polydipsia. Does not bruise/bleed easily.  Psychiatric/Behavioral: Negative.    All other ROS negative except what is listed above and in the HPI.      Objective:    BP 113/74    Pulse 80    Temp 98.8 F (37.1 C)    Ht 5\' 4"  (1.626 m)    Wt 186 lb 9.6 oz (84.6 kg)    SpO2 97%    BMI 32.03 kg/m   Wt Readings from Last 3 Encounters:  10/26/21 186 lb 9.6 oz (84.6 kg)  09/13/21 185 lb 6.4 oz (84.1 kg)  12/10/17 183 lb (83 kg)    Physical Exam Vitals and nursing note reviewed.  Constitutional:      General: She is not in acute distress.    Appearance: Normal appearance. She is not ill-appearing, toxic-appearing or diaphoretic.  HENT:     Head: Normocephalic and atraumatic.     Right Ear: Tympanic membrane, ear canal and external ear normal. There is no impacted cerumen.     Left Ear: Tympanic membrane, ear canal and external ear normal. There is no impacted cerumen.     Nose: Nose normal. No congestion or rhinorrhea.     Mouth/Throat:     Mouth: Mucous membranes are moist.     Pharynx: Oropharynx is clear. No oropharyngeal exudate or posterior oropharyngeal erythema.  Eyes:     General: No scleral icterus.       Right eye: No discharge.        Left eye: No discharge.     Extraocular Movements: Extraocular movements intact.     Conjunctiva/sclera: Conjunctivae normal.     Pupils: Pupils are equal, round, and reactive to light.  Neck:     Vascular: No carotid bruit.  Cardiovascular:     Rate and Rhythm: Normal rate and regular rhythm.     Pulses: Normal pulses.     Heart sounds: No murmur heard.   No friction rub. No gallop.  Pulmonary:     Effort: Pulmonary effort is normal. No respiratory  distress.     Breath sounds: Normal breath sounds. No stridor. No wheezing, rhonchi or rales.  Chest:     Chest wall: No tenderness.  Abdominal:     General: Abdomen is flat. Bowel sounds  are normal. There is no distension.     Palpations: Abdomen is soft. There is no mass.     Tenderness: There is no abdominal tenderness. There is no right CVA tenderness, left CVA tenderness, guarding or rebound.     Hernia: No hernia is present.  Genitourinary:    Comments: Breast and pelvic exams deferred with shared decision making Musculoskeletal:        General: Tenderness (bicipital tendon attachment) present. No swelling, deformity or signs of injury.     Cervical back: Normal range of motion and neck supple. No rigidity. No muscular tenderness.     Right lower leg: No edema.     Left lower leg: No edema.  Lymphadenopathy:     Cervical: No cervical adenopathy.  Skin:    General: Skin is warm and dry.     Capillary Refill: Capillary refill takes less than 2 seconds.     Coloration: Skin is not jaundiced or pale.     Findings: No bruising, erythema, lesion or rash.  Neurological:     General: No focal deficit present.     Mental Status: She is alert and oriented to person, place, and time. Mental status is at baseline.     Cranial Nerves: No cranial nerve deficit.     Sensory: No sensory deficit.     Motor: No weakness.     Coordination: Coordination normal.     Gait: Gait normal.     Deep Tendon Reflexes: Reflexes normal.  Psychiatric:        Mood and Affect: Mood normal.        Behavior: Behavior normal.        Thought Content: Thought content normal.        Judgment: Judgment normal.    Results for orders placed or performed in visit on 10/05/21  HM MAMMOGRAPHY  Result Value Ref Range   HM Mammogram 0-4 Bi-Rad 0-4 Bi-Rad, Self Reported Normal      Assessment & Plan:   Problem List Items Addressed This Visit       Other   Elevated prolactin level    Rechecking labs today.  Await results. Treat as needed.       Relevant Orders   Prolactin   Other Visit Diagnoses     Routine general medical examination at a health care facility    -  Primary   Vaccines up to date. Screening labs checked today. Pap and mammo up to date. Colonoscopy ordered today. Continue diet and exercise. Call with any concerns.    Relevant Orders   CBC with Differential/Platelet   Comprehensive metabolic panel   Lipid Panel w/o Chol/HDL Ratio   Urinalysis, Routine w reflex microscopic   TSH   Chronic left shoulder pain       Will check x-rays and start stretches. Call with any concerns. Continue to monitor. Call with any concerns.    Relevant Medications   cyclobenzaprine (FLEXERIL) 10 MG tablet   Other Relevant Orders   DG Shoulder Left   Screening for colon cancer       Referral to GI placed today.   Relevant Orders   Ambulatory referral to Gastroenterology        Follow up plan: Return in about 1 year (around 10/26/2022), or physical.   LABORATORY TESTING:  - Pap smear: up to date  IMMUNIZATIONS:   - Tdap: Tetanus vaccination status reviewed: last tetanus booster within 10 years. - Influenza: Up to date - Pneumovax:  Not applicable - Prevnar: Not applicable - COVID: Up to date  SCREENING: -Mammogram: Up to date  - Colonoscopy: Ordered today   PATIENT COUNSELING:   Advised to take 1 mg of folate supplement per day if capable of pregnancy.   Sexuality: Discussed sexually transmitted diseases, partner selection, use of condoms, avoidance of unintended pregnancy  and contraceptive alternatives.   Advised to avoid cigarette smoking.  I discussed with the patient that most people either abstain from alcohol or drink within safe limits (<=14/week and <=4 drinks/occasion for males, <=7/weeks and <= 3 drinks/occasion for females) and that the risk for alcohol disorders and other health effects rises proportionally with the number of drinks per week and how often a  drinker exceeds daily limits.  Discussed cessation/primary prevention of drug use and availability of treatment for abuse.   Diet: Encouraged to adjust caloric intake to maintain  or achieve ideal body weight, to reduce intake of dietary saturated fat and total fat, to limit sodium intake by avoiding high sodium foods and not adding table salt, and to maintain adequate dietary potassium and calcium preferably from fresh fruits, vegetables, and low-fat dairy products.    stressed the importance of regular exercise  Injury prevention: Discussed safety belts, safety helmets, smoke detector, smoking near bedding or upholstery.   Dental health: Discussed importance of regular tooth brushing, flossing, and dental visits.    NEXT PREVENTATIVE PHYSICAL DUE IN 1 YEAR. Return in about 1 year (around 10/26/2022), or physical.

## 2021-10-26 NOTE — Assessment & Plan Note (Signed)
Rechecking labs today. Await results. Treat as needed.  °

## 2021-10-27 ENCOUNTER — Encounter: Payer: Self-pay | Admitting: Family Medicine

## 2021-10-27 LAB — COMPREHENSIVE METABOLIC PANEL
ALT: 10 IU/L (ref 0–32)
AST: 14 IU/L (ref 0–40)
Albumin/Globulin Ratio: 1.3 (ref 1.2–2.2)
Albumin: 4.2 g/dL (ref 3.8–4.8)
Alkaline Phosphatase: 72 IU/L (ref 44–121)
BUN/Creatinine Ratio: 10 (ref 9–23)
BUN: 7 mg/dL (ref 6–24)
Bilirubin Total: <0.2 mg/dL (ref 0.0–1.2)
CO2: 23 mmol/L (ref 20–29)
Calcium: 9.2 mg/dL (ref 8.7–10.2)
Chloride: 104 mmol/L (ref 96–106)
Creatinine, Ser: 0.7 mg/dL (ref 0.57–1.00)
Globulin, Total: 3.2 g/dL (ref 1.5–4.5)
Glucose: 96 mg/dL (ref 70–99)
Potassium: 4.1 mmol/L (ref 3.5–5.2)
Sodium: 139 mmol/L (ref 134–144)
Total Protein: 7.4 g/dL (ref 6.0–8.5)
eGFR: 106 mL/min/{1.73_m2} (ref >59)

## 2021-10-27 LAB — LIPID PANEL W/O CHOL/HDL RATIO
Cholesterol, Total: 213 mg/dL — ABNORMAL HIGH (ref 100–199)
HDL: 69 mg/dL (ref >39)
LDL Chol Calc (NIH): 130 mg/dL — ABNORMAL HIGH (ref 0–99)
Triglycerides: 81 mg/dL (ref 0–149)
VLDL Cholesterol Cal: 14 mg/dL (ref 5–40)

## 2021-10-27 LAB — CBC WITH DIFFERENTIAL/PLATELET
Basophils Absolute: 0 10*3/uL (ref 0.0–0.2)
Basos: 1 %
EOS (ABSOLUTE): 0.1 10*3/uL (ref 0.0–0.4)
Eos: 1 %
Hematocrit: 41.5 % (ref 34.0–46.6)
Hemoglobin: 13.5 g/dL (ref 11.1–15.9)
Immature Grans (Abs): 0 10*3/uL (ref 0.0–0.1)
Immature Granulocytes: 0 %
Lymphocytes Absolute: 1.5 10*3/uL (ref 0.7–3.1)
Lymphs: 38 %
MCH: 28.7 pg (ref 26.6–33.0)
MCHC: 32.5 g/dL (ref 31.5–35.7)
MCV: 88 fL (ref 79–97)
Monocytes Absolute: 0.3 10*3/uL (ref 0.1–0.9)
Monocytes: 8 %
Neutrophils Absolute: 2.1 10*3/uL (ref 1.4–7.0)
Neutrophils: 52 %
Platelets: 345 10*3/uL (ref 150–450)
RBC: 4.7 x10E6/uL (ref 3.77–5.28)
RDW: 13 % (ref 11.7–15.4)
WBC: 4 10*3/uL (ref 3.4–10.8)

## 2021-10-27 LAB — PROLACTIN: Prolactin: 18.8 ng/mL (ref 4.8–23.3)

## 2021-10-27 LAB — TSH: TSH: 2.42 u[IU]/mL (ref 0.450–4.500)

## 2021-11-23 ENCOUNTER — Telehealth: Payer: Self-pay

## 2021-11-23 ENCOUNTER — Other Ambulatory Visit: Payer: Self-pay

## 2021-11-23 DIAGNOSIS — Z1211 Encounter for screening for malignant neoplasm of colon: Secondary | ICD-10-CM

## 2021-11-23 MED ORDER — CLENPIQ 10-3.5-12 MG-GM -GM/160ML PO SOLN: 1.0000 | ORAL | 0 refills | Status: DC

## 2021-11-23 NOTE — Telephone Encounter (Signed)
Patient is wanting to schedule her colonoscopy

## 2021-11-23 NOTE — Progress Notes (Signed)
Gastroenterology Pre-Procedure Review  Request Date: 12/30/2021 Requesting Physician: Dr. Marius Ditch  PATIENT REVIEW QUESTIONS: The patient responded to the following health history questions as indicated:    1. Are you having any GI issues? no 2. Do you have a personal history of Polyps? no 3. Do you have a family history of Colon Cancer or Polyps? no 4. Diabetes Mellitus? no 5. Joint replacements in the past 12 months?no 6. Major health problems in the past 3 months?no 7. Any artificial heart valves, MVP, or defibrillator?no    MEDICATIONS & ALLERGIES:    Patient reports the following regarding taking any anticoagulation/antiplatelet therapy:   Plavix, Coumadin, Eliquis, Xarelto, Lovenox, Pradaxa, Brilinta, or Effient? no Aspirin? no  Patient confirms/reports the following medications:  Current Outpatient Medications  Medication Sig Dispense Refill   cyclobenzaprine (FLEXERIL) 10 MG tablet Take 1 tablet (10 mg total) by mouth at bedtime as needed for muscle spasms. 30 tablet 1   fluticasone (FLONASE) 50 MCG/ACT nasal spray Place 2 sprays into both nostrils daily. 16 g 6   naproxen (NAPROSYN) 500 MG tablet Take 1 tablet (500 mg total) by mouth 2 (two) times daily with a meal. 60 tablet 3   No current facility-administered medications for this visit.    Patient confirms/reports the following allergies:  No Known Allergies  No orders of the defined types were placed in this encounter.   AUTHORIZATION INFORMATION Primary Insurance: 1D#: Group #:  Secondary Insurance: 1D#: Group #:  SCHEDULE INFORMATION: Date: 12/30/2021 Time: Location:[facility]

## 2021-12-26 ENCOUNTER — Telehealth: Payer: Self-pay

## 2021-12-26 MED ORDER — CLENPIQ 10-3.5-12 MG-GM -GM/160ML PO SOLN: 1.0000 | ORAL | 0 refills | Status: DC

## 2021-12-26 NOTE — Telephone Encounter (Signed)
CALLED PATIENT NO ANSWER LEFT VOICEMAIL FOR A CALL BACK ? ?

## 2021-12-26 NOTE — Telephone Encounter (Signed)
Patient left a message has some questions about prep

## 2021-12-26 NOTE — Telephone Encounter (Signed)
Wanted prep sent in again to pharmacy I resent prep

## 2021-12-29 ENCOUNTER — Encounter: Payer: Self-pay | Admitting: Gastroenterology

## 2021-12-30 ENCOUNTER — Other Ambulatory Visit: Payer: Self-pay

## 2021-12-30 ENCOUNTER — Encounter: Payer: Self-pay | Admitting: Gastroenterology

## 2021-12-30 ENCOUNTER — Ambulatory Visit
Admission: RE | Admit: 2021-12-30 | Discharge: 2021-12-30 | Disposition: A | Discharge status: Home / Self Care | Payer: Managed Care, Other (non HMO) | Attending: Gastroenterology | Admitting: Gastroenterology

## 2021-12-30 ENCOUNTER — Ambulatory Visit: Payer: Managed Care, Other (non HMO) | Admitting: Anesthesiology

## 2021-12-30 ENCOUNTER — Encounter
Admission: RE | Disposition: A | Discharge status: Home / Self Care | Payer: Self-pay | Source: Home / Self Care | Attending: Gastroenterology

## 2021-12-30 DIAGNOSIS — K635 Polyp of colon: Secondary | ICD-10-CM

## 2021-12-30 DIAGNOSIS — K219 Gastro-esophageal reflux disease without esophagitis: Secondary | ICD-10-CM | POA: Diagnosis not present

## 2021-12-30 DIAGNOSIS — D122 Benign neoplasm of ascending colon: Secondary | ICD-10-CM | POA: Insufficient documentation

## 2021-12-30 DIAGNOSIS — Z1211 Encounter for screening for malignant neoplasm of colon: Secondary | ICD-10-CM | POA: Insufficient documentation

## 2021-12-30 HISTORY — PX: COLONOSCOPY WITH PROPOFOL: SHX5780

## 2021-12-30 LAB — POCT PREGNANCY, URINE: Preg Test, Ur: NEGATIVE

## 2021-12-30 SURGERY — COLONOSCOPY WITH PROPOFOL
Anesthesia: General

## 2021-12-30 MED ORDER — SODIUM CHLORIDE 0.9 % IV SOLN: INTRAVENOUS | Status: DC

## 2021-12-30 MED ORDER — PROPOFOL 10 MG/ML IV BOLUS
INTRAVENOUS | Status: DC | PRN
  Administered 2021-12-30: 30 mg via INTRAVENOUS
  Administered 2021-12-30: 70 mg via INTRAVENOUS

## 2021-12-30 MED ORDER — LIDOCAINE HCL (CARDIAC) PF 100 MG/5ML IV SOSY
PREFILLED_SYRINGE | INTRAVENOUS | Status: DC | PRN
  Administered 2021-12-30: 50 mg via INTRAVENOUS

## 2021-12-30 MED ORDER — PROPOFOL 500 MG/50ML IV EMUL
INTRAVENOUS | Status: DC | PRN
  Administered 2021-12-30: 140 ug/kg/min via INTRAVENOUS

## 2021-12-30 MED ORDER — PROPOFOL 500 MG/50ML IV EMUL
INTRAVENOUS | Status: AC
  Filled 2021-12-30: qty 50

## 2021-12-30 NOTE — H&P (Signed)
Jonathon Bellows, MD [address] [place], [place], [place], [address] [address], [place], [place], [place], [address] Phone: [phone]  Fax: [phone]  Primary Care Physician:  Valerie Roys, DO   Pre-Procedure History & Physical: HPI:  Nancy Phillips is a 51 y.o. female is here for an colonoscopy.   History reviewed. No pertinent past medical history.  Past Surgical History:  Procedure Laterality Date   ABLATION  04/2021   TUBAL LIGATION      Prior to Admission medications   Medication Sig Start Date End Date Taking? Authorizing Provider  cyclobenzaprine (FLEXERIL) 10 MG tablet Take 1 tablet (10 mg total) by mouth at bedtime as needed for muscle spasms. 10/26/21  Yes Johnson, Megan P, DO  fluticasone (FLONASE) 50 MCG/ACT nasal spray Place 2 sprays into both nostrils daily. 12/10/17  Yes Johnson, Megan P, DO  naproxen (NAPROSYN) 500 MG tablet Take 1 tablet (500 mg total) by mouth 2 (two) times daily with a meal. 09/13/21  Yes Johnson, Megan P, DO  Sod Picosulfate-Mag Ox-Cit Acd (CLENPIQ) 10-3.5-12 MG-GM -GM/160ML SOLN Take 1 kit by mouth as directed. At 5 PM evening before procedure, drink 1 bottle of Clenpiq, hydrate, drink (5) 8 oz of water. Then do the same thing 5 hours prior to your procedure. Patient not taking: Reported on 12/30/2021 11/23/21   Lin Landsman, MD  Sod Picosulfate-Mag Ox-Cit Acd [facility]) 10-3.5-12 MG-GM -GM/160ML SOLN Take 1 kit by mouth as directed. At 5 PM evening before procedure, drink 1 bottle of Clenpiq, hydrate, drink (5) 8 oz of water. Then do the same thing 5 hours prior to your procedure. Patient not taking: Reported on 12/30/2021 12/26/21   Jonathon Bellows, MD    Allergies as of 11/23/2021   (No Known Allergies)    Family History  Problem Relation Age of Onset   Rheum arthritis Mother    Hypertension Mother    Fibromyalgia Mother    Stroke Mother    Cancer Father        Rare stomach cancer   Cancer Brother        Lung    Cancer Maternal Grandmother        Cancer   Stroke Maternal Grandfather    Birth defects Paternal Grandmother        Rare stomach cancer   Cancer Sister        Pancreatic   Cancer Sister        Pancreatic   Breast cancer Neg Hx     Social History   Socioeconomic History   Marital status: Single    Spouse name: Not on file   Number of children: Not on file   Years of education: Not on file   Highest education level: Not on file  Occupational History   Not on file  Tobacco Use   Smoking status: Never   Smokeless tobacco: Never  Vaping Use   Vaping Use: Never used  Substance and Sexual Activity   Alcohol use: No   Drug use: No   Sexual activity: Yes    Birth control/protection: None  Other Topics Concern   Not on file  Social History Narrative   Not on file   Social Determinants of Health   Financial Resource Strain: Not on file  Food Insecurity: Not on file  Transportation Needs: Not on file  Physical Activity: Not on file  Stress: Not on file  Social Connections: Not on file  Intimate Partner  Violence: Not on file    Review of Systems: See HPI, otherwise negative ROS  Physical Exam: BP 127/81    Pulse 70    Temp 97.6 F (36.4 C) (Temporal)    Resp 20    Ht '5\' 5"'  (1.651 m)    Wt 74.8 kg    SpO2 96%    BMI 27.46 kg/m  General:   Alert,  pleasant and cooperative in NAD Head:  Normocephalic and atraumatic. Neck:  Supple; no masses or thyromegaly. Lungs:  Clear throughout to auscultation, normal respiratory effort.    Heart:  +S1, +S2, Regular rate and rhythm, No edema. Abdomen:  Soft, nontender and nondistended. Normal bowel sounds, without guarding, and without rebound.   Neurologic:  Alert and  oriented x4;  grossly normal neurologically.  Impression/Plan: Nancy Phillips is here for an colonoscopy to be performed for Screening colonoscopy average risk   Risks, benefits, limitations, and alternatives regarding  colonoscopy have been reviewed with the  patient.  Questions have been answered.  All parties agreeable.   Jonathon Bellows, MD  12/30/2021, 11:05 AM

## 2021-12-30 NOTE — Op Note (Signed)
[facility] Gastroenterology Patient Name: Nancy Phillips Procedure Date: 12/30/2021 11:00 AM MRN: [id number] Account #: [id number] Date of Birth: 30-Oct-1971 Admit Type: Outpatient Age: 51 Room: [facility] ENDO ROOM 4 Gender: Female Note Status: Finalized Instrument Name: Jasper Riling [id number] Procedure:             Colonoscopy Indications:           Screening for colorectal malignant neoplasm Providers:             Jonathon Bellows MD, MD Medicines:             Monitored Anesthesia Care Complications:         No immediate complications. Procedure:             Pre-Anesthesia Assessment:                        - Prior to the procedure, a History and Physical was                         performed, and patient medications, allergies and                         sensitivities were reviewed. The patient's tolerance                         of previous anesthesia was reviewed.                        - The risks and benefits of the procedure and the                         sedation options and risks were discussed with the                         patient. All questions were answered and informed                         consent was obtained.                        - ASA Grade Assessment: II - A patient with mild                         systemic disease.                        After obtaining informed consent, the colonoscope was                         passed under direct vision. Throughout the procedure,                         the patient's blood pressure, pulse, and oxygen                         saturations were monitored continuously. The                         Colonoscope was introduced through the anus and  advanced to the the cecum, identified by the                         appendiceal orifice. The colonoscopy was performed                         with ease. The patient tolerated the procedure well.                         The quality of the bowel  preparation was good. Findings:      The perianal and digital rectal examinations were normal.      A 3 mm polyp was found in the ascending colon. The polyp was sessile.       The polyp was removed with a jumbo cold forceps. Resection and retrieval       were complete.      The exam was otherwise without abnormality. Impression:            - One 3 mm polyp in the ascending colon, removed with                         a jumbo cold forceps. Resected and retrieved.                        - The examination was otherwise normal. Recommendation:        - Discharge patient to home (with escort).                        - Resume previous diet.                        - Continue present medications.                        - Await pathology results.                        - Repeat colonoscopy for surveillance based on                         pathology results. Procedure Code(s):     --- Professional ---                        [phone], Colonoscopy, flexible; with biopsy, single or                         multiple Diagnosis Code(s):     --- Professional ---                        Z12.11, Encounter for screening for malignant neoplasm                         of colon                        K63.5, Polyp of colon CPT copyright 2019 American Medical Association. All rights reserved. The codes documented in this report are preliminary and upon coder review may  be revised to meet current compliance requirements. Jonathon Bellows, MD Jonathon Bellows MD,  MD 12/30/2021 11:30:47 AM This report has been signed electronically. Number of Addenda: 0 Note Initiated On: 12/30/2021 11:00 AM Scope Withdrawal Time: 0 hours 10 minutes 53 seconds  Total Procedure Duration: 0 hours 13 minutes 10 seconds  Estimated Blood Loss:  Estimated blood loss: none.      [facility]

## 2021-12-30 NOTE — Anesthesia Procedure Notes (Signed)
Date/Time: 12/30/2021 11:14 AM Performed by: Lily Peer, Maheen Cwikla, CRNA Pre-anesthesia Checklist: Patient identified, Emergency Drugs available, Suction available, Patient being monitored and Timeout performed Patient Re-evaluated:Patient Re-evaluated prior to induction Oxygen Delivery Method: Nasal cannula Induction Type: IV induction

## 2021-12-30 NOTE — Anesthesia Postprocedure Evaluation (Signed)
Anesthesia Post Note  Patient: Nancy Phillips  Procedure(s) Performed: COLONOSCOPY WITH PROPOFOL  Patient location during evaluation: Endoscopy Anesthesia Type: General Level of consciousness: awake and alert Pain management: pain level controlled Vital Signs Assessment: post-procedure vital signs reviewed and stable Respiratory status: spontaneous breathing, nonlabored ventilation, respiratory function stable and patient connected to nasal cannula oxygen Cardiovascular status: blood pressure returned to baseline and stable Postop Assessment: no apparent nausea or vomiting Anesthetic complications: no   No notable events documented.   Last Vitals:  Vitals:   12/30/21 1140 12/30/21 1150  BP: 107/87 120/71  Pulse:    Resp:    Temp:    SpO2:      Last Pain:  Vitals:   12/30/21 1150  TempSrc:   PainSc: 0-No pain                 Precious Haws Lakiah Dhingra

## 2021-12-30 NOTE — Anesthesia Preprocedure Evaluation (Signed)
Anesthesia Evaluation  Patient identified by MRN, date of birth, ID band Patient awake    Reviewed: Allergy & Precautions, NPO status , Patient's Chart, lab work & pertinent test results  History of Anesthesia Complications Negative for: history of anesthetic complications  Airway Mallampati: III  TM Distance: >3 FB Neck ROM: full    Dental  (+) Chipped   Pulmonary neg pulmonary ROS, neg shortness of breath,    Pulmonary exam normal        Cardiovascular Exercise Tolerance: Good (-) angina(-) Past MI and (-) DOE negative cardio ROS Normal cardiovascular exam     Neuro/Psych negative neurological ROS  negative psych ROS   GI/Hepatic Neg liver ROS, GERD  Controlled,  Endo/Other  negative endocrine ROS  Renal/GU negative Renal ROS  negative genitourinary   Musculoskeletal   Abdominal   Peds  Hematology negative hematology ROS (+)   Anesthesia Other Findings History reviewed. No pertinent past medical history.  Past Surgical History: 04/2021: ABLATION No date: TUBAL LIGATION  BMI    Body Mass Index: 27.46 kg/m      Reproductive/Obstetrics negative OB ROS                             Anesthesia Physical Anesthesia Plan  ASA: 2  Anesthesia Plan: General   Post-op Pain Management:    Induction: Intravenous  PONV Risk Score and Plan: Propofol infusion and TIVA  Airway Management Planned: Natural Airway and Nasal Cannula  Additional Equipment:   Intra-op Plan:   Post-operative Plan:   Informed Consent: I have reviewed the patients History and Physical, chart, labs and discussed the procedure including the risks, benefits and alternatives for the proposed anesthesia with the patient or authorized representative who has indicated his/her understanding and acceptance.     Dental Advisory Given  Plan Discussed with: Anesthesiologist, CRNA and Surgeon  Anesthesia Plan  Comments: (Patient consented for risks of anesthesia including but not limited to:  - adverse reactions to medications - risk of airway placement if required - damage to eyes, teeth, lips or other oral mucosa - nerve damage due to positioning  - sore throat or hoarseness - Damage to heart, brain, nerves, lungs, other parts of body or loss of life  Patient voiced understanding.)        Anesthesia Quick Evaluation

## 2021-12-30 NOTE — Transfer of Care (Signed)
Immediate Anesthesia Transfer of Care Note  Patient: Nancy Phillips  Procedure(s) Performed: COLONOSCOPY WITH PROPOFOL  Patient Location: Endoscopy Unit  Anesthesia Type:General  Level of Consciousness: awake, alert  and oriented  Airway & Oxygen Therapy: Patient Spontanous Breathing  Post-op Assessment: Report given to RN and Post -op Vital signs reviewed and stable  Post vital signs: Reviewed and stable  Last Vitals:  Vitals Value Taken Time  BP 101/53   Temp    Pulse 85 12/30/21 1131  Resp 28 12/30/21 1131  SpO2 100 % 12/30/21 1131  Vitals shown include unvalidated device data.  Last Pain:  Vitals:   12/30/21 0933  TempSrc: Temporal  PainSc: 0-No pain         Complications: No notable events documented.

## 2022-01-02 ENCOUNTER — Encounter: Payer: Self-pay | Admitting: Gastroenterology

## 2022-01-02 LAB — SURGICAL PATHOLOGY

## 2022-01-04 ENCOUNTER — Encounter: Payer: Self-pay | Admitting: Gastroenterology

## 2022-12-11 ENCOUNTER — Encounter: Payer: Managed Care, Other (non HMO) | Admitting: Family Medicine

## 2023-01-10 ENCOUNTER — Telehealth: Payer: Self-pay | Admitting: Family Medicine

## 2023-01-10 NOTE — Telephone Encounter (Signed)
Copied from [place] [id number]. Topic: General - Other >> Jan 10, 2023 11:32 AM Oley Balm A wrote: Reason for CRM: Pt is calling wanting her PCP to send in a medication refill for her medicine used for muscle spasms. Pt does not know the name of the medication. Please advise.

## 2023-01-10 NOTE — Telephone Encounter (Signed)
Pt was called and scheduled !

## 2023-01-11 ENCOUNTER — Ambulatory Visit: Payer: Managed Care, Other (non HMO) | Admitting: Physician Assistant

## 2023-01-11 ENCOUNTER — Encounter: Payer: Self-pay | Admitting: Physician Assistant

## 2023-01-11 VITALS — BP 111/77 | HR 70 | Temp 99.5°F | Wt 188.2 lb

## 2023-01-11 DIAGNOSIS — M546 Pain in thoracic spine: Secondary | ICD-10-CM

## 2023-01-11 DIAGNOSIS — M62838 Other muscle spasm: Secondary | ICD-10-CM | POA: Diagnosis not present

## 2023-01-11 MED ORDER — PREDNISONE 20 MG PO TABS: ORAL_TABLET | ORAL | 0 refills | Status: DC

## 2023-01-11 MED ORDER — CYCLOBENZAPRINE HCL 10 MG PO TABS: 10.0000 mg | ORAL_TABLET | Freq: Every evening | ORAL | 0 refills | Status: DC | PRN

## 2023-01-11 NOTE — Patient Instructions (Signed)
Based on your symptoms and physical exam I believe the following is the cause of your concern today Back pain likely secondary to a strain of your back muscles  I recommend the following at this time to help relieve that discomfort:  Rest Warm compresses to the area (20 minutes on, minimum of 30 minutes off) You can alternate Tylenol and Ibuprofen for pain management but Ibuprofen is typically preferred to reduce inflammation.  Gentle stretches and exercises that I have included in your paperwork Try to reduce excess strain to the area and rest as much as possible  Wear supportive shoes and, if you must lift anything, use proper lifting techniques that spare your back.   If these measures do not lead to improvement in your symptoms over the next 2-4 weeks please let us know

## 2023-01-11 NOTE — Progress Notes (Signed)
Acute Office Visit   Patient: Nancy Phillips   DOB: 11/18/1970   52 y.o. Female  MRN: [id number] Visit Date: 01/11/2023  Today's healthcare provider: Dani Gobble Davante Gerke, PA-C  Introduced myself to the patient as a Journalist, newspaper and provided education on APPs in clinical practice.    Chief Complaint  Patient presents with   Spasms    Patient back around [place] she noticed a cough and then after the cough, but now she noticed a pulling sensation across her shoulder blade area and middle back area. Patient says she has been seen in the past for a muscle strain in her neck.   Subjective    HPI HPI     Spasms    Additional comments: Patient back around [place] she noticed a cough and then after the cough, but now she noticed a pulling sensation across her shoulder blade area and middle back area. Patient says she has been seen in the past for a muscle strain in her neck.      Last edited by Irena Reichmann, [place] on 01/11/2023  9:14 AM.       Back Spasms She thinks this is likely from her coughing last month She reports straining in her upper back and a lot of tensio in the area  Onset: gradual Duration: since Jan   Location: upper back and between shoulder blades  Pain level and character: nagging ache, almost constant, can get up to 10/10  Interventions: Aleve muscle and pain Alleviating: aleve seems to help a little bit but does not provide lasting relief  Aggravating: sitting straight to drive, laying in wrong position     Medications: Outpatient Medications Prior to Visit  Medication Sig   fluticasone (FLONASE) 50 MCG/ACT nasal spray Place 2 sprays into both nostrils daily.   naproxen (NAPROSYN) 500 MG tablet Take 1 tablet (500 mg total) by mouth 2 (two) times daily with a meal.   cyclobenzaprine (FLEXERIL) 10 MG tablet Take 1 tablet (10 mg total) by mouth at bedtime as needed for muscle spasms. (Patient not taking: Reported on 01/11/2023)   Sod Picosulfate-Mag Ox-Cit Acd  (CLENPIQ) 10-3.5-12 MG-GM -GM/160ML SOLN Take 1 kit by mouth as directed. At 5 PM evening before procedure, drink 1 bottle of Clenpiq, hydrate, drink (5) 8 oz of water. Then do the same thing 5 hours prior to your procedure. (Patient not taking: Reported on 12/30/2021)   Sod Picosulfate-Mag Ox-Cit Acd (CLENPIQ) 10-3.5-12 MG-GM -GM/160ML SOLN Take 1 kit by mouth as directed. At 5 PM evening before procedure, drink 1 bottle of Clenpiq, hydrate, drink (5) 8 oz of water. Then do the same thing 5 hours prior to your procedure. (Patient not taking: Reported on 12/30/2021)   No facility-administered medications prior to visit.    Review of Systems  Respiratory:  Positive for shortness of breath (mild, intermittent). Negative for cough, chest tightness and wheezing.   Musculoskeletal:  Positive for back pain.  Neurological:  Negative for tremors, weakness and numbness.       Objective    BP 111/77   Pulse 70   Temp 99.5 F (37.5 C) (Oral)   Wt 188 lb 3.2 oz (85.4 kg)   SpO2 99%   BMI 31.32 kg/m    Physical Exam Vitals reviewed.  Constitutional:      General: She is awake.     Appearance: Normal appearance. She is well-developed and well-groomed.  HENT:  Head: Normocephalic and atraumatic.  Neck:     Comments: Mild pain with lateral flexion to the right and lateral rotation to the right with neck ROM  Cardiovascular:     Rate and Rhythm: Normal rate and regular rhythm.  Pulmonary:     Effort: Pulmonary effort is normal.     Breath sounds: Normal breath sounds. No decreased air movement. No decreased breath sounds, wheezing, rhonchi or rales.  Musculoskeletal:        General: Normal range of motion.     Cervical back: Normal range of motion. Tenderness present. No swelling, deformity, signs of trauma or rigidity. Pain with movement and muscular tenderness present. No spinous process tenderness. Normal range of motion.     Thoracic back: Spasms and tenderness present. Normal range of  motion.  Skin:    General: Skin is warm and dry.  Neurological:     General: No focal deficit present.     Mental Status: She is alert and oriented to person, place, and time. Mental status is at baseline.  Psychiatric:        Mood and Affect: Mood normal.        Behavior: Behavior normal. Behavior is cooperative.        Thought Content: Thought content normal.        Judgment: Judgment normal.       No results found for any visits on 01/11/23.  Assessment & Plan      No follow-ups on file.     Problem List Items Addressed This Visit   None Visit Diagnoses     Acute bilateral thoracic back pain    -  Primary Acute, new concern Reports ongoing upper back and neck pain that is likely muscular in nature given HPI and PE  Will attempt initial management with conservative measures: warm compresses, NSAIDs, Tylenol, gentle massage and stretches Will also provide Prednisone taper for further inflammation relief and Flexeril to assist with muscle tension and sleep. Reviewed stretches and return precautions Follow up in 2-4 weeks for persistent or progressing symptoms    Relevant Medications   predniSONE (DELTASONE) 20 MG tablet   cyclobenzaprine (FLEXERIL) 10 MG tablet   Muscle spasms of neck     See above for A&P     Relevant Medications   cyclobenzaprine (FLEXERIL) 10 MG tablet        No follow-ups on file.   I, Ionia Schey E Drea Jurewicz, PA-C, have reviewed all documentation for this visit. The documentation on 01/11/23 for the exam, diagnosis, procedures, and orders are all accurate and complete.   Talitha Givens, MHS, PA-C [facility]

## 2023-02-06 ENCOUNTER — Encounter: Payer: Self-pay | Admitting: Family Medicine

## 2023-02-06 ENCOUNTER — Ambulatory Visit (INDEPENDENT_AMBULATORY_CARE_PROVIDER_SITE_OTHER): Payer: Managed Care, Other (non HMO) | Admitting: Family Medicine

## 2023-02-06 VITALS — BP 114/76 | HR 101 | Temp 98.2°F | Ht 64.0 in | Wt 188.7 lb

## 2023-02-06 DIAGNOSIS — Z0000 Encounter for general adult medical examination without abnormal findings: Secondary | ICD-10-CM

## 2023-02-06 DIAGNOSIS — M546 Pain in thoracic spine: Secondary | ICD-10-CM

## 2023-02-06 DIAGNOSIS — Z1231 Encounter for screening mammogram for malignant neoplasm of breast: Secondary | ICD-10-CM | POA: Diagnosis not present

## 2023-02-06 DIAGNOSIS — G8929 Other chronic pain: Secondary | ICD-10-CM | POA: Diagnosis not present

## 2023-02-06 LAB — URINALYSIS, ROUTINE W REFLEX MICROSCOPIC
Bilirubin, UA: NEGATIVE
Glucose, UA: NEGATIVE
Leukocytes,UA: NEGATIVE
Nitrite, UA: NEGATIVE
Protein,UA: NEGATIVE
RBC, UA: NEGATIVE
Specific Gravity, UA: >1.03 — ABNORMAL HIGH (ref 1.005–1.030)
Urobilinogen, Ur: 0.2 mg/dL (ref 0.2–1.0)
pH, UA: 5 (ref 5.0–7.5)

## 2023-02-06 NOTE — Patient Instructions (Signed)
Please call to schedule your mammogram and/or bone density: [facility]  Address: [address], [place], [place] [address] Phone: [phone]  [facility] [place] [address] [place],  [place]  [address] Phone: [phone]   

## 2023-02-06 NOTE — Progress Notes (Signed)
BP 114/76   Pulse (!) 101   Temp 98.2 F (36.8 C) (Oral)   Ht 5\' 4"  (1.626 m)   Wt 188 lb 11.2 oz (85.6 kg)   SpO2 99%   BMI 32.39 kg/m    Subjective:    Patient ID: Nancy Phillips, female    DOB: [birth year], 52 y.o.   MRN: [id number]  HPI: Nancy Phillips is a 52 y.o. female presenting on 02/06/2023 for comprehensive medical examination. Current medical complaints include:  BACK PAIN Duration: couple of months Mechanism of injury: was sick and coughing Location: Upper R back Onset: sudden Severity: moderate Quality: nagging, tight Frequency: intermittent Radiation: intermittent Aggravating factors: bending over Alleviating factors: positional Status: better Treatments attempted: prednisone, naproxen, flexeril  Relief with NSAIDs?: mild Nighttime pain:  no Paresthesias / decreased sensation:  no Bowel / bladder incontinence:  no Fevers:  no Dysuria / urinary frequency:  no  Menopausal Symptoms: no  Depression Screen done today and results listed below:     02/06/2023    1:19 PM 10/26/2021    8:21 AM 09/13/2021   10:23 AM 07/23/2017    3:36 PM  Depression screen PHQ 2/9  Decreased Interest 0 0 1 0  Down, Depressed, Hopeless 0 0 0 0  PHQ - 2 Score 0 0 1 0  Altered sleeping 0 1 2   Tired, decreased energy 0 1 1   Change in appetite 0 0 0   Feeling bad or failure about yourself  0 0 0   Trouble concentrating 0 0 1   Moving slowly or fidgety/restless 0 0 0   Suicidal thoughts 0 0 0   PHQ-9 Score 0 2 5   Difficult doing work/chores   Somewhat difficult    Past Medical History:  History reviewed. No pertinent past medical history.  Surgical History:  Past Surgical History:  Procedure Laterality Date   ABLATION  04/2021   COLONOSCOPY WITH PROPOFOL N/A 12/30/2021   Procedure: COLONOSCOPY WITH PROPOFOL;  Surgeon: Jonathon Bellows, MD;  Location: [facility] ENDOSCOPY;  Service: Gastroenterology;  Laterality: N/A;   TUBAL LIGATION      Medications:  Current Outpatient  Medications on File Prior to Visit  Medication Sig   cyclobenzaprine (FLEXERIL) 10 MG tablet Take 1 tablet (10 mg total) by mouth at bedtime as needed for muscle spasms.   fluticasone (FLONASE) 50 MCG/ACT nasal spray Place 2 sprays into both nostrils daily.   naproxen (NAPROSYN) 500 MG tablet Take 1 tablet (500 mg total) by mouth 2 (two) times daily with a meal.   No current facility-administered medications on file prior to visit.    Allergies:  No Known Allergies  Social History:  Social History   Socioeconomic History   Marital status: Single    Spouse name: Not on file   Number of children: Not on file   Years of education: Not on file   Highest education level: Not on file  Occupational History   Not on file  Tobacco Use   Smoking status: Never   Smokeless tobacco: Never  Vaping Use   Vaping Use: Never used  Substance and Sexual Activity   Alcohol use: No   Drug use: No   Sexual activity: Yes    Birth control/protection: None  Other Topics Concern   Not on file  Social History Narrative   Not on file   Social Determinants of Health   Financial Resource Strain: Not on file  Food Insecurity: Not on file  Transportation Needs: Not on file  Physical Activity: Not on file  Stress: Not on file  Social Connections: Not on file  Intimate Partner Violence: Not on file   Social History   Tobacco Use  Smoking Status Never  Smokeless Tobacco Never   Social History   Substance and Sexual Activity  Alcohol Use No    Family History:  Family History  Problem Relation Age of Onset   Rheum arthritis Mother    Hypertension Mother    Fibromyalgia Mother    Stroke Mother    Cancer Father        Rare stomach cancer   Cancer Brother        Lung   Cancer Maternal Grandmother        Cancer   Stroke Maternal Grandfather    Birth defects Paternal Grandmother        Rare stomach cancer   Cancer Sister        Pancreatic   Cancer Sister        Pancreatic    Breast cancer Neg Hx     Past medical history, surgical history, medications, allergies, family history and social history reviewed with patient today and changes made to appropriate areas of the chart.   Review of Systems  Constitutional:  Positive for diaphoresis. Negative for chills, fever, malaise/fatigue and weight loss.  HENT: Negative.    Eyes: Negative.   Respiratory: Negative.    Cardiovascular:  Positive for leg swelling. Negative for chest pain, palpitations, orthopnea, claudication and PND.  Gastrointestinal:  Positive for abdominal pain, constipation, diarrhea and heartburn. Negative for blood in stool, melena, nausea and vomiting.  Genitourinary: Negative.   Musculoskeletal:  Positive for joint pain. Negative for back pain, falls, myalgias and neck pain.  Skin: Negative.   Neurological: Negative.   Endo/Heme/Allergies:  Positive for environmental allergies. Negative for polydipsia. Does not bruise/bleed easily.  Psychiatric/Behavioral: Negative.     All other ROS negative except what is listed above and in the HPI.      Objective:    BP 114/76   Pulse (!) 101   Temp 98.2 F (36.8 C) (Oral)   Ht 5\' 4"  (1.626 m)   Wt 188 lb 11.2 oz (85.6 kg)   SpO2 99%   BMI 32.39 kg/m   Wt Readings from Last 3 Encounters:  02/06/23 188 lb 11.2 oz (85.6 kg)  01/11/23 188 lb 3.2 oz (85.4 kg)  12/30/21 165 lb (74.8 kg)    Physical Exam Vitals and nursing note reviewed.  Constitutional:      General: She is not in acute distress.    Appearance: Normal appearance. She is not ill-appearing, toxic-appearing or diaphoretic.  HENT:     Head: Normocephalic and atraumatic.     Right Ear: Tympanic membrane, ear canal and external ear normal. There is no impacted cerumen.     Left Ear: Tympanic membrane, ear canal and external ear normal. There is no impacted cerumen.     Nose: Nose normal. No congestion or rhinorrhea.     Mouth/Throat:     Mouth: Mucous membranes are moist.      Pharynx: Oropharynx is clear. No oropharyngeal exudate or posterior oropharyngeal erythema.  Eyes:     General: No scleral icterus.       Right eye: No discharge.        Left eye: No discharge.     Extraocular Movements: Extraocular movements intact.     Conjunctiva/sclera: Conjunctivae normal.  Pupils: Pupils are equal, round, and reactive to light.  Neck:     Vascular: No carotid bruit.  Cardiovascular:     Rate and Rhythm: Normal rate and regular rhythm.     Pulses: Normal pulses.     Heart sounds: No murmur heard.    No friction rub. No gallop.  Pulmonary:     Effort: Pulmonary effort is normal. No respiratory distress.     Breath sounds: Normal breath sounds. No stridor. No wheezing, rhonchi or rales.  Chest:     Chest wall: No tenderness.  Abdominal:     General: Abdomen is flat. Bowel sounds are normal. There is no distension.     Palpations: Abdomen is soft. There is no mass.     Tenderness: There is no abdominal tenderness. There is no right CVA tenderness, left CVA tenderness, guarding or rebound.     Hernia: No hernia is present.  Genitourinary:    Comments: Breast and pelvic exams deferred with shared decision making Musculoskeletal:        General: No swelling, tenderness, deformity or signs of injury.     Cervical back: Normal range of motion and neck supple. No rigidity. No muscular tenderness.     Right lower leg: No edema.     Left lower leg: No edema.  Lymphadenopathy:     Cervical: No cervical adenopathy.  Skin:    General: Skin is warm and dry.     Capillary Refill: Capillary refill takes less than 2 seconds.     Coloration: Skin is not jaundiced or pale.     Findings: No bruising, erythema, lesion or rash.  Neurological:     General: No focal deficit present.     Mental Status: She is alert and oriented to person, place, and time. Mental status is at baseline.     Cranial Nerves: No cranial nerve deficit.     Sensory: No sensory deficit.     Motor:  No weakness.     Coordination: Coordination normal.     Gait: Gait normal.     Deep Tendon Reflexes: Reflexes normal.  Psychiatric:        Mood and Affect: Mood normal.        Behavior: Behavior normal.        Thought Content: Thought content normal.        Judgment: Judgment normal.     Results for orders placed or performed during the hospital encounter of 12/30/21  Pregnancy, urine POC  Result Value Ref Range   Preg Test, Ur NEGATIVE NEGATIVE  Surgical pathology  Result Value Ref Range   SURGICAL PATHOLOGY      SURGICAL PATHOLOGY CASE: [id number] PATIENT: Nancy Phillips Surgical Pathology Report     Specimen Submitted: A. Colon polyp, ascending; cbx  Clinical History: Colon cancer screening Z12.11.  Colon polyp    DIAGNOSIS: A. COLON POLYP, ASCENDING; COLD BIOPSY: - POLYPOID FRAGMENT OF BENIGN COLONIC MUCOSA WITH SUPERFICIAL REACTIVE CHANGES AND SMALL LYMPHOID AGGREGATE. - NEGATIVE FOR DYSPLASIA AND MALIGNANCY.  Comment: Multiple additional deeper recut levels were examined.  GROSS DESCRIPTION: A. Labeled: Ascending colon polyp cbx Received: Formalin Collection time: 11:21 AM on 12/30/2021 Placed into formalin time: 11:21 AM on 01/27/2022 Tissue fragment(s): 1 Size: 0.3 x 0.2 x 0.2 cm Description: Tan soft tissue fragment Entirely submitted in 1 cassette.  RB 12/30/2021  Final Diagnosis performed by Allena Napoleon, MD.   Electronically signed 01/02/2022 12:51:11PM The electronic signature indicates that the named Attending Pathologist  has evaluated the specim en Technical component performed at [facility], [address] [place], [place] [address] Lab: [phone] Dir: Rush Farmer, MD, MMM  Professional component performed at [facility], [facility], [place], [place],  [address] Lab: [phone] Dir: Kathi Simpers, MD       Assessment & Plan:   Problem List Items Addressed This Visit   None Visit Diagnoses      Routine general medical examination at a health care facility    -  Primary   Vaccines up to date. Screening labs checked today. Pap and colonoscopy up to date. Mammogram ordered. Continue diet and exercise. Call with any concerns.   Relevant Orders   CBC with Differential/Platelet   Comprehensive metabolic panel   Lipid Panel w/o Chol/HDL Ratio   Urinalysis, Routine w reflex microscopic   TSH   Chronic right-sided thoracic back pain       Not doing well. Will obtain x-ray and start PT. Call if not getting better or getting worse.   Relevant Orders   Ambulatory referral to Physical Therapy   DG Thoracic Spine W/Swimmers   Encounter for screening mammogram for malignant neoplasm of breast       Mammogram ordered today.   Relevant Orders   MM 3D SCREENING MAMMOGRAM BILATERAL BREAST        Follow up plan: Return in about 1 year (around 02/06/2024) for Physical.   LABORATORY TESTING:  - Pap smear: up to date  IMMUNIZATIONS:   - Tdap: Tetanus vaccination status reviewed: last tetanus booster within 10 years. - Influenza: Up to date - Pneumovax: Not applicable - Prevnar: Not applicable - COVID: Up to date - HPV: Not applicable - Shingrix vaccine: Refused  SCREENING: -Mammogram: Up to date  - Colonoscopy: Up to date   PATIENT COUNSELING:   Advised to take 1 mg of folate supplement per day if capable of pregnancy.   Sexuality: Discussed sexually transmitted diseases, partner selection, use of condoms, avoidance of unintended pregnancy  and contraceptive alternatives.   Advised to avoid cigarette smoking.  I discussed with the patient that most people either abstain from alcohol or drink within safe limits (<=14/week and <=4 drinks/occasion for males, <=7/weeks and <= 3 drinks/occasion for females) and that the risk for alcohol disorders and other health effects rises proportionally with the number of drinks per week and how often a drinker exceeds daily limits.  Discussed  cessation/primary prevention of drug use and availability of treatment for abuse.   Diet: Encouraged to adjust caloric intake to maintain  or achieve ideal body weight, to reduce intake of dietary saturated fat and total fat, to limit sodium intake by avoiding high sodium foods and not adding table salt, and to maintain adequate dietary potassium and calcium preferably from fresh fruits, vegetables, and low-fat dairy products.    stressed the importance of regular exercise  Injury prevention: Discussed safety belts, safety helmets, smoke detector, smoking near bedding or upholstery.   Dental health: Discussed importance of regular tooth brushing, flossing, and dental visits.    NEXT PREVENTATIVE PHYSICAL DUE IN 1 YEAR. Return in about 1 year (around 02/06/2024) for Physical.

## 2023-02-07 LAB — CBC WITH DIFFERENTIAL/PLATELET
Basophils Absolute: 0 10*3/uL (ref 0.0–0.2)
Basos: 0 %
EOS (ABSOLUTE): 0.1 10*3/uL (ref 0.0–0.4)
Eos: 1 %
Hematocrit: 39.7 % (ref 34.0–46.6)
Hemoglobin: 13.4 g/dL (ref 11.1–15.9)
Immature Grans (Abs): 0 10*3/uL (ref 0.0–0.1)
Immature Granulocytes: 0 %
Lymphocytes Absolute: 2.5 10*3/uL (ref 0.7–3.1)
Lymphs: 39 %
MCH: 29.4 pg (ref 26.6–33.0)
MCHC: 33.8 g/dL (ref 31.5–35.7)
MCV: 87 fL (ref 79–97)
Monocytes Absolute: 0.6 10*3/uL (ref 0.1–0.9)
Monocytes: 9 %
Neutrophils Absolute: 3.2 10*3/uL (ref 1.4–7.0)
Neutrophils: 51 %
Platelets: 303 10*3/uL (ref 150–450)
RBC: 4.56 x10E6/uL (ref 3.77–5.28)
RDW: 12.5 % (ref 11.7–15.4)
WBC: 6.3 10*3/uL (ref 3.4–10.8)

## 2023-02-07 LAB — COMPREHENSIVE METABOLIC PANEL
ALT: 28 IU/L (ref 0–32)
AST: 25 IU/L (ref 0–40)
Albumin/Globulin Ratio: 1.3 (ref 1.2–2.2)
Albumin: 4.2 g/dL (ref 3.8–4.9)
Alkaline Phosphatase: 72 IU/L (ref 44–121)
BUN/Creatinine Ratio: 10 (ref 9–23)
BUN: 9 mg/dL (ref 6–24)
Bilirubin Total: 0.4 mg/dL (ref 0.0–1.2)
CO2: 23 mmol/L (ref 20–29)
Calcium: 9.8 mg/dL (ref 8.7–10.2)
Chloride: 103 mmol/L (ref 96–106)
Creatinine, Ser: 0.88 mg/dL (ref 0.57–1.00)
Globulin, Total: 3.2 g/dL (ref 1.5–4.5)
Glucose: 82 mg/dL (ref 70–99)
Potassium: 3.6 mmol/L (ref 3.5–5.2)
Sodium: 141 mmol/L (ref 134–144)
Total Protein: 7.4 g/dL (ref 6.0–8.5)
eGFR: 80 mL/min/{1.73_m2} (ref >59)

## 2023-02-07 LAB — LIPID PANEL W/O CHOL/HDL RATIO
Cholesterol, Total: 235 mg/dL — ABNORMAL HIGH (ref 100–199)
HDL: 81 mg/dL (ref >39)
LDL Chol Calc (NIH): 134 mg/dL — ABNORMAL HIGH (ref 0–99)
Triglycerides: 116 mg/dL (ref 0–149)
VLDL Cholesterol Cal: 20 mg/dL (ref 5–40)

## 2023-02-07 LAB — TSH: TSH: 1.46 u[IU]/mL (ref 0.450–4.500)

## 2023-06-14 LAB — HM PAP SMEAR: HM Pap smear: NEGATIVE

## 2023-06-14 LAB — HM MAMMOGRAPHY

## 2023-06-20 ENCOUNTER — Encounter: Payer: Self-pay | Admitting: Family Medicine

## 2023-06-27 ENCOUNTER — Encounter: Payer: Self-pay | Admitting: Family Medicine

## 2023-07-04 ENCOUNTER — Encounter: Payer: Self-pay | Admitting: Family Medicine

## 2024-01-04 ENCOUNTER — Ambulatory Visit (INDEPENDENT_AMBULATORY_CARE_PROVIDER_SITE_OTHER): Payer: Managed Care, Other (non HMO)

## 2024-01-04 ENCOUNTER — Ambulatory Visit: Payer: Managed Care, Other (non HMO)

## 2024-01-04 DIAGNOSIS — Z23 Encounter for immunization: Secondary | ICD-10-CM | POA: Diagnosis not present

## 2024-01-04 NOTE — Progress Notes (Signed)
 Patient is in office today for a nurse visit for Immunization. Patient Injection was given in the  Left deltoid. Patient tolerated injection well.

## 2024-02-20 ENCOUNTER — Ambulatory Visit: Payer: Self-pay

## 2024-02-20 NOTE — Telephone Encounter (Signed)
 FYI only. Patient scheduled for visit tomorrow 02/21/2024.
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
## 2024-02-21 ENCOUNTER — Ambulatory Visit: Admitting: Family Medicine

## 2024-02-21 ENCOUNTER — Ambulatory Visit
Admission: RE | Admit: 2024-02-21 | Discharge: 2024-02-21 | Disposition: A | Discharge status: Home / Self Care | Source: Ambulatory Visit

## 2024-02-21 ENCOUNTER — Ambulatory Visit
Admission: RE | Admit: 2024-02-21 | Discharge: 2024-02-21 | Disposition: A | Discharge status: Home / Self Care | Attending: Family Medicine | Admitting: Family Medicine

## 2024-02-21 ENCOUNTER — Encounter: Payer: Self-pay | Admitting: Family Medicine

## 2024-02-21 VITALS — BP 129/83 | HR 65

## 2024-02-21 DIAGNOSIS — M542 Cervicalgia: Secondary | ICD-10-CM

## 2024-02-21 DIAGNOSIS — R202 Paresthesia of skin: Secondary | ICD-10-CM | POA: Diagnosis present

## 2024-02-21 DIAGNOSIS — M546 Pain in thoracic spine: Secondary | ICD-10-CM | POA: Diagnosis present
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
## 2024-02-25 ENCOUNTER — Encounter

## 2024-02-26 ENCOUNTER — Ambulatory Visit: Admitting: Family Medicine

## 2024-02-26 VITALS — BP 143/96 | HR 71 | Ht 64.0 in | Wt 191.8 lb

## 2024-02-26 DIAGNOSIS — M546 Pain in thoracic spine: Secondary | ICD-10-CM | POA: Diagnosis not present

## 2024-02-26 DIAGNOSIS — M542 Cervicalgia: Secondary | ICD-10-CM | POA: Diagnosis not present

## 2024-02-26 MED ORDER — HYDROCODONE-ACETAMINOPHEN 5-325 MG PO TABS: 1.0000 | ORAL_TABLET | Freq: Four times a day (QID) | ORAL | 0 refills | Status: DC | PRN

## 2024-02-26 NOTE — Progress Notes (Signed)
 BP (!) 143/96 (BP Location: Left Arm, Patient Position: Sitting)   Pulse 71   Ht 5\' 4"  (1.626 m)   Wt 191 lb 12.8 oz (87 kg)   SpO2 99%   BMI 32.92 kg/m    Subjective:    Patient ID: Nancy Phillips, female    DOB: 1971/07/17, 53 y.o.   MRN: [id number]  HPI: Nancy Phillips is a 53 y.o. female  Chief Complaint  Patient presents with   Neck Pain   NECK PAIN  Status: uncontrolled Treatments attempted: muscle relaxer, stretches, ibuprofen, norco  Compliant with recommended treatment: yes Relief with NSAIDs?:  mild Location: widespread Duration: about a week Severity: severe Quality: tight and stretching Frequency: constant Radiation: headache Aggravating factors: working on the computer, sitting for a long time Alleviating factors: muscle relaxer, moving Weakness:  yes Paresthesias / decreased sensation:  yes  Fevers:  no   Relevant past medical, surgical, family and social history reviewed and updated as indicated. Interim medical history since our last visit reviewed. Allergies and medications reviewed and updated.  Review of Systems  Constitutional: Negative.   Respiratory: Negative.    Cardiovascular: Negative.   Musculoskeletal:  Positive for arthralgias, back pain, myalgias, neck pain and neck stiffness. Negative for gait problem and joint swelling.  Skin: Negative.   Neurological: Negative.   Psychiatric/Behavioral: Negative.      Per HPI unless specifically indicated above     Objective:    BP (!) 143/96 (BP Location: Left Arm, Patient Position: Sitting)   Pulse 71   Ht 5\' 4"  (1.626 m)   Wt 191 lb 12.8 oz (87 kg)   SpO2 99%   BMI 32.92 kg/m   Wt Readings from Last 3 Encounters:  02/26/24 191 lb 12.8 oz (87 kg)  02/06/23 188 lb 11.2 oz (85.6 kg)  01/11/23 188 lb 3.2 oz (85.4 kg)    Physical Exam Vitals and nursing note reviewed.  Constitutional:      General: She is not in acute distress.    Appearance: Normal appearance. She is well-developed.   HENT:     Head: Normocephalic and atraumatic.     Right Ear: Hearing and external ear normal.     Left Ear: Hearing and external ear normal.     Nose: Nose normal.     Mouth/Throat:     Mouth: Mucous membranes are moist.     Pharynx: Oropharynx is clear.  Eyes:     General: Lids are normal. No scleral icterus.       Right eye: No discharge.        Left eye: No discharge.     Conjunctiva/sclera: Conjunctivae normal.  Pulmonary:     Effort: Pulmonary effort is normal. No respiratory distress.  Musculoskeletal:        General: Normal range of motion.  Skin:    Coloration: Skin is not jaundiced or pale.     Findings: No bruising, erythema, lesion or rash.  Neurological:     Mental Status: She is alert. Mental status is at baseline. She is disoriented.  Psychiatric:        Mood and Affect: Mood normal.        Speech: Speech normal.        Behavior: Behavior normal.        Thought Content: Thought content normal.        Judgment: Judgment normal.     Results for orders placed or performed in visit on 06/20/23  HM MAMMOGRAPHY  Collection Time: 06/14/23 12:00 AM  Result Value Ref Range   HM Mammogram 0-4 Bi-Rad 0-4 Bi-Rad, Self Reported Normal      Assessment & Plan:   Problem List Items Addressed This Visit   None Visit Diagnoses       Neck pain    -  Primary   Waiting on x-ray results. Will keep her out of work and get her into PT. Call with any concerns.   Relevant Orders   Ambulatory referral to Physical Therapy     Acute bilateral thoracic back pain       Waiting on x-ray results. Will keep her out of work and get her into PT. Call with any concerns.   Relevant Medications   HYDROcodone -acetaminophen  (NORCO/VICODIN) 5-325 MG tablet   Other Relevant Orders   Ambulatory referral to Physical Therapy        Follow up plan: Return in about 3 weeks (around 03/18/2024) for ok to double book if need be.

## 2024-02-28 ENCOUNTER — Telehealth: Admitting: Family Medicine

## 2024-02-28 ENCOUNTER — Encounter: Payer: Self-pay | Admitting: Family Medicine

## 2024-02-29 ENCOUNTER — Telehealth: Payer: Self-pay
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
## 2024-02-29 NOTE — Telephone Encounter (Signed)
 Not sure if this actually needs verbal orders but OK to give if needed.

## 2024-03-20 ENCOUNTER — Encounter: Payer: Self-pay | Admitting: Family Medicine

## 2024-03-20 ENCOUNTER — Ambulatory Visit: Admitting: Family Medicine

## 2024-03-20 VITALS — BP 118/77 | HR 70 | Temp 98.4°F | Resp 16 | Ht 64.0 in | Wt 190.4 lb

## 2024-03-20 DIAGNOSIS — M542 Cervicalgia: Secondary | ICD-10-CM | POA: Diagnosis not present

## 2024-03-20 DIAGNOSIS — M546 Pain in thoracic spine: Secondary | ICD-10-CM
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
## 2024-04-02 ENCOUNTER — Telehealth: Payer: Self-pay | Admitting: Family Medicine

## 2024-04-02 NOTE — Telephone Encounter (Signed)
 Yes, she was supposed to send new copy- but I don't think I've seen it yet

## 2024-04-02 NOTE — Telephone Encounter (Signed)
 Needs to be 1/2 day until 6/28

## 2024-04-02 NOTE — Telephone Encounter (Signed)
 Copied from CRM [id number]. Topic: General - Other >> Apr 01, 2024 11:02 AM Everette C wrote: Reason for CRM: The patient has called to follow up on a response from their insurance provider regarding their Intermediate FMLA extension that was discussed with their PCP for continuation of their Physical Therapy   The patient would like to be contacted further if/when possible

## 2024-04-02 NOTE — Telephone Encounter (Signed)
 Copied from CRM [id number]. Topic: General - Other >> Apr 02, 2024 12:10 PM Marissa P wrote: Reason for CRM: Nancy Phillips called to follow up on a response from their insurance provider regarding their Intermediate FMLA extension that was discussed with their PCP for continuation of their Physical Therapy    The patient would like to be contacted further if/when possible  They need to know if the patient will need to work half days everyday until June 28th or only half days on the days she has therapy please  Please advise [phone] ask for [facility]

## 2024-04-03 NOTE — Telephone Encounter (Signed)
 Patient advised we need a new copy of the paperwork. She will fax it later today or drop it off tomorrow.

## 2024-05-08 ENCOUNTER — Encounter: Payer: Self-pay | Admitting: Family Medicine

## 2024-05-08 ENCOUNTER — Ambulatory Visit (INDEPENDENT_AMBULATORY_CARE_PROVIDER_SITE_OTHER): Admitting: Family Medicine

## 2024-05-08 VITALS — BP 123/81 | HR 65 | Temp 98.0°F | Ht 64.0 in | Wt 189.2 lb

## 2024-05-08 DIAGNOSIS — Z789 Other specified health status: Secondary | ICD-10-CM | POA: Diagnosis not present

## 2024-05-08 DIAGNOSIS — Z0000 Encounter for general adult medical examination without abnormal findings: Secondary | ICD-10-CM

## 2024-05-08 DIAGNOSIS — R7989 Other specified abnormal findings of blood chemistry: Secondary | ICD-10-CM | POA: Diagnosis not present

## 2024-05-08 DIAGNOSIS — N393 Stress incontinence (female) (male): Secondary | ICD-10-CM | POA: Diagnosis not present

## 2024-05-08 DIAGNOSIS — K219 Gastro-esophageal reflux disease without esophagitis: Secondary | ICD-10-CM

## 2024-05-08 LAB — BAYER DCA HB A1C WAIVED: HB A1C (BAYER DCA - WAIVED): 5.4 % (ref 4.8–5.6)

## 2024-05-08 MED ORDER — OMEPRAZOLE 20 MG PO CPDR: 20.0000 mg | DELAYED_RELEASE_CAPSULE | Freq: Every day | ORAL | 3 refills | Status: AC | PRN

## 2024-05-08 NOTE — Assessment & Plan Note (Signed)
 Will start omeprazole . Call with any concerns.

## 2024-05-08 NOTE — Progress Notes (Signed)
 BP 123/81   Pulse 65   Temp 98 F (36.7 C) (Oral)   Ht 5' 4 (1.626 m)   Wt 189 lb 3.2 oz (85.8 kg)   SpO2 100%   BMI 32.48 kg/m    Subjective:    Patient ID: Nancy Phillips, female    DOB: [birth year], 53 y.o.   MRN: [id number]  HPI: Nancy Phillips is a 53 y.o. female presenting on 05/08/2024 for comprehensive medical examination. Current medical complaints include:  Feeling much better with her back. Back to work. Continue to monitor.   Menopausal Symptoms: no  Depression Screen done today and results listed below:     05/08/2024    8:16 AM 03/20/2024    4:18 PM 02/21/2024    9:09 AM 02/06/2023    1:19 PM 10/26/2021    8:21 AM  Depression screen PHQ 2/9  Decreased Interest 0 1 0 0 0  Down, Depressed, Hopeless 0 0 0 0 0  PHQ - 2 Score 0 1 0 0 0  Altered sleeping 1 1 2  0 1  Tired, decreased energy 0 0 0 0 1  Change in appetite 0 0 1 0 0  Feeling bad or failure about yourself  0 0 0 0 0  Trouble concentrating 0 0 0 0 0  Moving slowly or fidgety/restless 0 0 0 0 0  Suicidal thoughts 0 0  0 0  PHQ-9 Score 1 2 3  0 2  Difficult doing work/chores Not difficult at all Somewhat difficult Somewhat difficult      Past Medical History:  History reviewed. No pertinent past medical history.  Surgical History:  Past Surgical History:  Procedure Laterality Date   ABLATION  04/2021   COLONOSCOPY WITH PROPOFOL  N/A 12/30/2021   Procedure: COLONOSCOPY WITH PROPOFOL ;  Surgeon: Therisa Bi, MD;  Location: [facility] ENDOSCOPY;  Service: Gastroenterology;  Laterality: N/A;   TUBAL LIGATION      Medications:  Current Outpatient Medications on File Prior to Visit  Medication Sig   metaxalone (SKELAXIN) 800 MG tablet Take 1 tablet by mouth 3 (three) times daily.   naproxen  (NAPROSYN ) 500 MG tablet Take 1 tablet (500 mg total) by mouth 2 (two) times daily with a meal.   No current facility-administered medications on file prior to visit.    Allergies:  No Known Allergies  Social History:   Social History   Socioeconomic History   Marital status: Single    Spouse name: Not on file   Number of children: 1   Years of education: Not on file   Highest education level: Associate degree: occupational, Scientist, product/process development, or vocational program  Occupational History   Not on file  Tobacco Use   Smoking status: Never   Smokeless tobacco: Never  Vaping Use   Vaping status: Never Used  Substance and Sexual Activity   Alcohol use: No   Drug use: No   Sexual activity: Yes    Birth control/protection: Condom, Post-menopausal, None  Other Topics Concern   Not on file  Social History Narrative   Not on file   Social Drivers of Health   Financial Resource Strain: Patient Declined (05/07/2024)   Overall Financial Resource Strain (CARDIA)    Difficulty of Paying Living Expenses: Patient declined  Food Insecurity: Unknown (05/07/2024)   Hunger Vital Sign    Worried About Running Out of Food in the Last Year: Patient declined    Ran Out of Food in the Last Year: Never true  Transportation Needs: No Transportation  Needs (05/07/2024)   PRAPARE - Administrator, Civil Service (Medical): No    Lack of Transportation (Non-Medical): No  Physical Activity: Insufficiently Active (05/07/2024)   Exercise Vital Sign    Days of Exercise per Week: 3 days    Minutes of Exercise per Session: 30 min  Stress: Patient Declined (05/07/2024)   [employer] of Occupational Health - Occupational Stress Questionnaire    Feeling of Stress: Patient declined  Social Connections: Moderately Integrated (05/07/2024)   Social Connection and Isolation Panel    Frequency of Communication with Friends and Family: Twice a week    Frequency of Social Gatherings with Friends and Family: Twice a week    Attends Religious Services: More than 4 times per year    Active Member of [employer] or Organizations: Yes    Attends Engineer, structural: 1 to 4 times per year    Marital Status: Divorced  Careers information officer Violence: Not on file   Social History   Tobacco Use  Smoking Status Never  Smokeless Tobacco Never   Social History   Substance and Sexual Activity  Alcohol Use No    Family History:  Family History  Problem Relation Age of Onset   Rheum arthritis Mother    Hypertension Mother    Fibromyalgia Mother    Stroke Mother    Arthritis Mother    Cancer Father        Rare stomach cancer   Cancer Brother        Lung   Cancer Maternal Grandmother        Cancer   Stroke Maternal Grandfather    Birth defects Paternal Grandmother        Rare stomach cancer   Cancer Sister        Pancreatic   Cancer Sister        Pancreatic   Breast cancer Neg Hx     Past medical history, surgical history, medications, allergies, family history and social history reviewed with patient today and changes made to appropriate areas of the chart.   Review of Systems  Constitutional:  Positive for diaphoresis. Negative for chills, fever, malaise/fatigue and weight loss.  HENT:  Positive for congestion. Negative for ear discharge, ear pain, hearing loss, nosebleeds, sinus pain, sore throat and tinnitus.   Eyes:  Positive for blurred vision. Negative for double vision, photophobia, pain, discharge and redness.  Respiratory: Negative.  Negative for stridor.   Cardiovascular:  Positive for leg swelling. Negative for chest pain, palpitations, orthopnea, claudication and PND.  Gastrointestinal:  Positive for constipation and heartburn. Negative for abdominal pain, blood in stool, diarrhea, melena, nausea and vomiting.  Genitourinary: Negative.        Stress incontience  Musculoskeletal: Negative.  Negative for back pain, falls, joint pain, myalgias and neck pain.  Skin: Negative.   Neurological: Negative.   Endo/Heme/Allergies:  Positive for environmental allergies and polydipsia. Does not bruise/bleed easily.  Psychiatric/Behavioral: Negative.     All other ROS negative except what is listed  above and in the HPI.      Objective:    BP 123/81   Pulse 65   Temp 98 F (36.7 C) (Oral)   Ht 5' 4 (1.626 m)   Wt 189 lb 3.2 oz (85.8 kg)   SpO2 100%   BMI 32.48 kg/m   Wt Readings from Last 3 Encounters:  05/08/24 189 lb 3.2 oz (85.8 kg)  03/20/24 190 lb 6.4 oz (86.4 kg)  02/26/24 191 lb 12.8 oz (87 kg)    Physical Exam Vitals and nursing note reviewed.  Constitutional:      General: She is not in acute distress.    Appearance: Normal appearance. She is not ill-appearing, toxic-appearing or diaphoretic.  HENT:     Head: Normocephalic and atraumatic.     Right Ear: Tympanic membrane, ear canal and external ear normal. There is no impacted cerumen.     Left Ear: Tympanic membrane, ear canal and external ear normal. There is no impacted cerumen.     Nose: Nose normal. No congestion or rhinorrhea.     Mouth/Throat:     Mouth: Mucous membranes are moist.     Pharynx: Oropharynx is clear. No oropharyngeal exudate or posterior oropharyngeal erythema.  Eyes:     General: No scleral icterus.       Right eye: No discharge.        Left eye: No discharge.     Extraocular Movements: Extraocular movements intact.     Conjunctiva/sclera: Conjunctivae normal.     Pupils: Pupils are equal, round, and reactive to light.  Neck:     Vascular: No carotid bruit.  Cardiovascular:     Rate and Rhythm: Normal rate and regular rhythm.     Pulses: Normal pulses.     Heart sounds: No murmur heard.    No friction rub. No gallop.  Pulmonary:     Effort: Pulmonary effort is normal. No respiratory distress.     Breath sounds: Normal breath sounds. No stridor. No wheezing, rhonchi or rales.  Chest:     Chest wall: No tenderness.  Abdominal:     General: Abdomen is flat. Bowel sounds are normal. There is no distension.     Palpations: Abdomen is soft. There is no mass.     Tenderness: There is no abdominal tenderness. There is no right CVA tenderness, left CVA tenderness, guarding or rebound.      Hernia: No hernia is present.  Genitourinary:    Comments: Breast and pelvic exams deferred with shared decision making Musculoskeletal:        General: No swelling, tenderness, deformity or signs of injury.     Cervical back: Normal range of motion and neck supple. No rigidity. No muscular tenderness.     Right lower leg: No edema.     Left lower leg: No edema.  Lymphadenopathy:     Cervical: No cervical adenopathy.  Skin:    General: Skin is warm and dry.     Capillary Refill: Capillary refill takes less than 2 seconds.     Coloration: Skin is not jaundiced or pale.     Findings: No bruising, erythema, lesion or rash.  Neurological:     General: No focal deficit present.     Mental Status: She is alert and oriented to person, place, and time. Mental status is at baseline.     Cranial Nerves: No cranial nerve deficit.     Sensory: No sensory deficit.     Motor: No weakness.     Coordination: Coordination normal.     Gait: Gait normal.     Deep Tendon Reflexes: Reflexes normal.  Psychiatric:        Mood and Affect: Mood normal.        Behavior: Behavior normal.        Thought Content: Thought content normal.        Judgment: Judgment normal.     Results for orders placed  or performed in visit on 06/20/23  [facility] MAMMOGRAPHY   Collection Time: 06/14/23 12:00 AM  Result Value Ref Range   [facility] Mammogram 0-4 Bi-Rad 0-4 Bi-Rad, Self Reported Normal      Assessment & Plan:   Problem List Items Addressed This Visit       Digestive   Gastroesophageal reflux disease   Will start omeprazole . Call with any concerns.        Relevant Medications   omeprazole  (PRILOSEC) 20 MG capsule     Other   Elevated prolactin level   Labs drawn today. Await results.       Relevant Orders   Prolactin   Stress incontinence   Will refer to urology. Call with any concerns.       Relevant Orders   Ambulatory referral to Urology   Other Visit Diagnoses       Routine general  medical examination at a health care facility    -  Primary   Vaccines up to date. Screening labs checked today. Pap and mammo through GYN. Colonoscopy up to date. Continue diet and exercise. Continue to monitor.   Relevant Orders   Bayer DCA Hb A1c Waived   CBC with Differential/Platelet   Comprehensive metabolic panel with GFR   Lipid Panel w/o Chol/HDL Ratio   TSH     Hepatitis B vaccination status unknown       Labs drawn today. Await results.   Relevant Orders   Hepatitis B surface antibody,quantitative        Follow up plan: Return in about 1 year (around 05/08/2025) for physical.   LABORATORY TESTING:  - Pap smear: up to date  IMMUNIZATIONS:   - Tdap: Tetanus vaccination status reviewed: last tetanus booster within 10 years. - Influenza: Postponed to flu season - Pneumovax: Not applicable - Prevnar: Not applicable - COVID: Refused - HPV: Not applicable - Shingrix vaccine: Refused  SCREENING: -Mammogram: Up to date  - Colonoscopy: Up to date   PATIENT COUNSELING:   Advised to take 1 mg of folate supplement per day if capable of pregnancy.   Sexuality: Discussed sexually transmitted diseases, partner selection, use of condoms, avoidance of unintended pregnancy  and contraceptive alternatives.   Advised to avoid cigarette smoking.  I discussed with the patient that most people either abstain from alcohol or drink within safe limits (<=14/week and <=4 drinks/occasion for males, <=7/weeks and <= 3 drinks/occasion for females) and that the risk for alcohol disorders and other health effects rises proportionally with the number of drinks per week and how often a drinker exceeds daily limits.  Discussed cessation/primary prevention of drug use and availability of treatment for abuse.   Diet: Encouraged to adjust caloric intake to maintain  or achieve ideal body weight, to reduce intake of dietary saturated fat and total fat, to limit sodium intake by avoiding high sodium  foods and not adding table salt, and to maintain adequate dietary potassium and calcium preferably from fresh fruits, vegetables, and low-fat dairy products.    stressed the importance of regular exercise  Injury prevention: Discussed safety belts, safety helmets, smoke detector, smoking near bedding or upholstery.   Dental health: Discussed importance of regular tooth brushing, flossing, and dental visits.    NEXT PREVENTATIVE PHYSICAL DUE IN 1 YEAR. Return in about 1 year (around 05/08/2025) for physical.

## 2024-05-08 NOTE — Assessment & Plan Note (Signed)
 Labs drawn today. Await results.

## 2024-05-08 NOTE — Assessment & Plan Note (Signed)
 Will refer to urology. Call with any concerns.

## 2024-05-11 ENCOUNTER — Ambulatory Visit: Payer: Self-pay | Admitting: Family Medicine

## 2024-05-11 DIAGNOSIS — R7989 Other specified abnormal findings of blood chemistry: Secondary | ICD-10-CM

## 2024-05-12 NOTE — Progress Notes (Signed)
 Lab appt scheduled.

## 2024-05-13 LAB — CBC WITH DIFFERENTIAL/PLATELET
Basophils Absolute: 0 x10E3/uL (ref 0.0–0.2)
Basos: 1 %
EOS (ABSOLUTE): 0.1 x10E3/uL (ref 0.0–0.4)
Eos: 1 %
Hematocrit: 42.2 % (ref 34.0–46.6)
Hemoglobin: 13.7 g/dL (ref 11.1–15.9)
Immature Grans (Abs): 0 x10E3/uL (ref 0.0–0.1)
Immature Granulocytes: 0 %
Lymphocytes Absolute: 1.7 x10E3/uL (ref 0.7–3.1)
Lymphs: 40 %
MCH: 30 pg (ref 26.6–33.0)
MCHC: 32.5 g/dL (ref 31.5–35.7)
MCV: 92 fL (ref 79–97)
Monocytes Absolute: 0.3 x10E3/uL (ref 0.1–0.9)
Monocytes: 8 %
Neutrophils Absolute: 2.2 x10E3/uL (ref 1.4–7.0)
Neutrophils: 49 %
Platelets: 293 x10E3/uL (ref 150–450)
RBC: 4.57 x10E6/uL (ref 3.77–5.28)
RDW: 12.6 % (ref 11.7–15.4)
WBC: 4.3 x10E3/uL (ref 3.4–10.8)

## 2024-05-13 LAB — COMPREHENSIVE METABOLIC PANEL WITH GFR
ALT: 19 IU/L (ref 0–32)
AST: 18 IU/L (ref 0–40)
Albumin: 4.2 g/dL (ref 3.8–4.9)
Alkaline Phosphatase: 72 IU/L (ref 44–121)
BUN/Creatinine Ratio: 12 (ref 9–23)
BUN: 10 mg/dL (ref 6–24)
Bilirubin Total: <0.2 mg/dL (ref 0.0–1.2)
CO2: 22 mmol/L (ref 20–29)
Calcium: 9.5 mg/dL (ref 8.7–10.2)
Chloride: 103 mmol/L (ref 96–106)
Creatinine, Ser: 0.81 mg/dL (ref 0.57–1.00)
Globulin, Total: 3.3 g/dL (ref 1.5–4.5)
Glucose: 82 mg/dL (ref 70–99)
Potassium: 4.3 mmol/L (ref 3.5–5.2)
Sodium: 140 mmol/L (ref 134–144)
Total Protein: 7.5 g/dL (ref 6.0–8.5)
eGFR: 87 mL/min/1.73 (ref >59)

## 2024-05-13 LAB — LIPID PANEL W/O CHOL/HDL RATIO
Cholesterol, Total: 231 mg/dL — ABNORMAL HIGH (ref 100–199)
HDL: 68 mg/dL (ref >39)
LDL Chol Calc (NIH): 146 mg/dL — ABNORMAL HIGH (ref 0–99)
Triglycerides: 98 mg/dL (ref 0–149)
VLDL Cholesterol Cal: 17 mg/dL (ref 5–40)

## 2024-05-13 LAB — HEPATITIS B SURFACE ANTIBODY, QUANTITATIVE: Hepatitis B Surf Ab Quant: 9.6 m[IU]/mL — AB

## 2024-05-13 LAB — TSH: TSH: 3.03 u[IU]/mL (ref 0.450–4.500)

## 2024-05-13 LAB — PROLACTIN: Prolactin: 46 ng/mL — ABNORMAL HIGH (ref 3.6–25.2)

## 2024-05-15 ENCOUNTER — Encounter: Payer: Self-pay | Admitting: Family Medicine

## 2024-06-12 ENCOUNTER — Other Ambulatory Visit

## 2024-06-12 DIAGNOSIS — R7989 Other specified abnormal findings of blood chemistry: Secondary | ICD-10-CM

## 2024-06-13 ENCOUNTER — Ambulatory Visit: Payer: Self-pay | Admitting: Family Medicine

## 2024-06-13 LAB — PROLACTIN: Prolactin: 17.2 ng/mL (ref 3.6–25.2)

## 2024-07-17 ENCOUNTER — Ambulatory Visit (INDEPENDENT_AMBULATORY_CARE_PROVIDER_SITE_OTHER)

## 2024-07-17 DIAGNOSIS — Z23 Encounter for immunization: Secondary | ICD-10-CM | POA: Diagnosis not present

## 2024-07-17 NOTE — Progress Notes (Signed)
 Patient is in office today for a nurse visit for Immunization. Patient Injection was given in the  Left deltoid. Patient tolerated injection well.

## 2024-08-18 LAB — RESULTS CONSOLE HPV: CHL HPV: NEGATIVE

## 2024-08-18 LAB — HM MAMMOGRAPHY

## 2024-08-18 LAB — HM PAP SMEAR

## 2024-08-20 LAB — HM PAP SMEAR

## 2024-08-21 ENCOUNTER — Encounter: Payer: Self-pay | Admitting: Family Medicine

## 2024-11-28 ENCOUNTER — Encounter: Payer: Self-pay | Admitting: Family Medicine

## 2025-05-11 ENCOUNTER — Encounter: Admitting: Family Medicine

## 2025-05-18 ENCOUNTER — Encounter: Admitting: Family Medicine
